# Patient Record
Sex: Female | Born: 1960 | Hispanic: No | Smoking: Never smoker
Health system: Southern US, Community
[De-identification: ages and names within clinical notes are randomized; demographics above are authoritative.]

## PROBLEM LIST (undated history)

## (undated) DIAGNOSIS — R7303 Prediabetes: Secondary | ICD-10-CM

## (undated) DIAGNOSIS — T7840XA Allergy, unspecified, initial encounter: Secondary | ICD-10-CM

## (undated) DIAGNOSIS — K219 Gastro-esophageal reflux disease without esophagitis: Secondary | ICD-10-CM

## (undated) DIAGNOSIS — I10 Essential (primary) hypertension: Secondary | ICD-10-CM

## (undated) DIAGNOSIS — H269 Unspecified cataract: Secondary | ICD-10-CM

## (undated) HISTORY — DX: Allergy, unspecified, initial encounter: T78.40XA

## (undated) HISTORY — DX: Unspecified cataract: H26.9

## (undated) HISTORY — DX: Gastro-esophageal reflux disease without esophagitis: K21.9

## (undated) HISTORY — PX: TUBAL LIGATION: SHX77

## (undated) HISTORY — DX: Prediabetes: R73.03

---

## 2002-05-24 ENCOUNTER — Ambulatory Visit (HOSPITAL_COMMUNITY): Admission: RE | Admit: 2002-05-24 | Discharge: 2002-05-24 | Payer: Self-pay | Admitting: Family Medicine

## 2002-05-31 ENCOUNTER — Encounter: Admission: RE | Admit: 2002-05-31 | Discharge: 2002-05-31 | Payer: Self-pay | Admitting: Family Medicine

## 2002-05-31 ENCOUNTER — Encounter: Payer: Self-pay | Admitting: Family Medicine

## 2003-07-06 ENCOUNTER — Ambulatory Visit (HOSPITAL_COMMUNITY): Admission: RE | Admit: 2003-07-06 | Discharge: 2003-07-06 | Payer: Self-pay | Admitting: Family Medicine

## 2012-12-29 ENCOUNTER — Ambulatory Visit: Payer: No Typology Code available for payment source | Attending: Internal Medicine

## 2013-02-14 ENCOUNTER — Ambulatory Visit: Payer: Self-pay | Admitting: Internal Medicine

## 2013-04-04 ENCOUNTER — Encounter: Payer: Self-pay | Admitting: Internal Medicine

## 2013-04-04 ENCOUNTER — Ambulatory Visit: Payer: No Typology Code available for payment source | Attending: Internal Medicine | Admitting: Internal Medicine

## 2013-04-04 VITALS — BP 125/86 | HR 76 | Temp 98.3°F | Resp 14 | Ht 62.0 in | Wt 167.0 lb

## 2013-04-04 DIAGNOSIS — Z23 Encounter for immunization: Secondary | ICD-10-CM

## 2013-04-04 DIAGNOSIS — R51 Headache: Secondary | ICD-10-CM | POA: Insufficient documentation

## 2013-04-04 DIAGNOSIS — Z0000 Encounter for general adult medical examination without abnormal findings: Secondary | ICD-10-CM

## 2013-04-04 LAB — CBC WITH DIFFERENTIAL/PLATELET
Basophils Absolute: 0 10*3/uL (ref 0.0–0.1)
Basophils Relative: 0 % (ref 0–1)
Eosinophils Absolute: 0 10*3/uL (ref 0.0–0.7)
Eosinophils Relative: 1 % (ref 0–5)
HCT: 37 % (ref 36.0–46.0)
Hemoglobin: 12.9 g/dL (ref 12.0–15.0)
Lymphocytes Relative: 35 % (ref 12–46)
Lymphs Abs: 1.9 10*3/uL (ref 0.7–4.0)
MCH: 31.2 pg (ref 26.0–34.0)
MCHC: 34.9 g/dL (ref 30.0–36.0)
MCV: 89.6 fL (ref 78.0–100.0)
Monocytes Absolute: 0.3 10*3/uL (ref 0.1–1.0)
Monocytes Relative: 5 % (ref 3–12)
Neutro Abs: 3.2 10*3/uL (ref 1.7–7.7)
Neutrophils Relative %: 59 % (ref 43–77)
Platelets: 232 10*3/uL (ref 150–400)
RBC: 4.13 MIL/uL (ref 3.87–5.11)
RDW: 13 % (ref 11.5–15.5)
WBC: 5.3 10*3/uL (ref 4.0–10.5)

## 2013-04-04 LAB — LIPID PANEL
Cholesterol: 167 mg/dL (ref 0–200)
HDL: 50 mg/dL (ref >39)
LDL Cholesterol: 98 mg/dL (ref 0–99)
Total CHOL/HDL Ratio: 3.3 Ratio
Triglycerides: 94 mg/dL (ref <150)
VLDL: 19 mg/dL (ref 0–40)

## 2013-04-04 LAB — COMPLETE METABOLIC PANEL WITH GFR
ALT: 34 U/L (ref 0–35)
AST: 25 U/L (ref 0–37)
Albumin: 4.3 g/dL (ref 3.5–5.2)
Alkaline Phosphatase: 65 U/L (ref 39–117)
BUN: 16 mg/dL (ref 6–23)
CO2: 27 mEq/L (ref 19–32)
Calcium: 9.4 mg/dL (ref 8.4–10.5)
Chloride: 106 mEq/L (ref 96–112)
Creat: 0.49 mg/dL — ABNORMAL LOW (ref 0.50–1.10)
GFR, Est African American: >89 mL/min
Glucose, Bld: 94 mg/dL (ref 70–99)
Potassium: 4.1 mEq/L (ref 3.5–5.3)
Sodium: 139 mEq/L (ref 135–145)
Total Bilirubin: 0.4 mg/dL (ref 0.2–1.2)
Total Protein: 6.6 g/dL (ref 6.0–8.3)

## 2013-04-04 LAB — POCT GLYCOSYLATED HEMOGLOBIN (HGB A1C): Hemoglobin A1C: 5.4

## 2013-04-04 LAB — TSH: TSH: 0.803 u[IU]/mL (ref 0.350–4.500)

## 2013-04-04 NOTE — Progress Notes (Signed)
Pt is here to establish care. Requests a physical and pap smear. Will have to reschedule an appointment for pap smear. Pt is using the interpreter line.

## 2013-04-04 NOTE — Progress Notes (Signed)
Patient ID: Alexandra Jennings, female   DOB: [birth year], 53 y.o.   MRN: [id number]   CC:  HPI:  53 year old female here to establish care, she complains of intermittent headaches once or twice a month especially when she's stressed out, history of migraine headaches. She has no other complaints. She had a mammogram 6 years ago, Pap smear 6 years ago.    Social history nonsmoker nonalcoholic Family history positive for cervical cancer in the mother Prostrate cancer in the father   Not on File History reviewed. No pertinent past medical history. No current outpatient prescriptions on file prior to visit.   No current facility-administered medications on file prior to visit.   Family History  Problem Relation Age of Onset  . Cancer Mother   . Cancer Father    History   Social History  . Marital Status: Single    Spouse Name: N/A    Number of Children: N/A  . Years of Education: N/A   Occupational History  . Not on file.   Social History Main Topics  . Smoking status: Never Smoker   . Smokeless tobacco: Not on file  . Alcohol Use: No  . Drug Use: No  . Sexual Activity: Not on file   Other Topics Concern  . Not on file   Social History Narrative  . No narrative on file    Review of Systems  Constitutional: Negative for fever, chills, diaphoresis, activity change, appetite change and fatigue.  HENT: Negative for ear pain, nosebleeds, congestion, facial swelling, rhinorrhea, neck pain, neck stiffness and ear discharge.   Eyes: Negative for pain, discharge, redness, itching and visual disturbance.  Respiratory: Negative for cough, choking, chest tightness, shortness of breath, wheezing and stridor.   Cardiovascular: Negative for chest pain, palpitations and leg swelling.  Gastrointestinal: Negative for abdominal distention.  Genitourinary: Negative for dysuria, urgency, frequency, hematuria, flank pain, decreased urine volume, difficulty urinating and  dyspareunia.  Musculoskeletal: Negative for back pain, joint swelling, arthralgias and gait problem.  Neurological: Negative for dizziness, tremors, seizures, syncope, facial asymmetry, speech difficulty, weakness, light-headedness, numbness and headaches.  Hematological: Negative for adenopathy. Does not bruise/bleed easily.  Psychiatric/Behavioral: Negative for hallucinations, behavioral problems, confusion, dysphoric mood, decreased concentration and agitation.    Objective:   Filed Vitals:   04/04/13 1027  BP: 125/86  Pulse: 76  Temp: 98.3 F (36.8 C)  Resp: 14    Physical Exam  Constitutional: Appears well-developed and well-nourished. No distress.  HENT: Normocephalic. External right and left ear normal. Oropharynx is clear and moist.  Eyes: Conjunctivae and EOM are normal. PERRLA, no scleral icterus.  Neck: Normal ROM. Neck supple. No JVD. No tracheal deviation. No thyromegaly.  CVS: RRR, S1/S2 +, no murmurs, no gallops, no carotid bruit.  Pulmonary: Effort and breath sounds normal, no stridor, rhonchi, wheezes, rales.  Abdominal: Soft. BS +,  no distension, tenderness, rebound or guarding.  Musculoskeletal: Normal range of motion. No edema and no tenderness.  Lymphadenopathy: No lymphadenopathy noted, cervical, inguinal. Neuro: Alert. Normal reflexes, muscle tone coordination. No cranial nerve deficit. Skin: Skin is warm and dry. No rash noted. Not diaphoretic. No erythema. No pallor.  Psychiatric: Normal mood and affect. Behavior, judgment, thought content normal.   No results found for this basename: WBC, HGB, HCT, MCV, PLT   No results found for this basename: CREATININE, BUN, NA, K, CL, CO2    No results found for this basename: HGBA1C   Lipid Panel  No results found for  this basename: chol, trig, hdl, cholhdl, vldl, ldlcalc       Assessment and plan:   There are no active problems to display for this patient.  Occasional headaches Patient to continue  taking when necessary Tylenol once or twice a month History of migraine headaches No further workup indicated  Establish care Gynecology referral for a Pap smear next is schedule the patient for a mammogram Tetanus and hepatitis B vaccine today Baseline labs The patient will return in one month to complete her series of 0, 1, 6 months of hepatitis B vaccination     Otherwise follow up in 3 months    The patient was given clear instructions to go to ER or return to medical center if symptoms don't improve, worsen or new problems develop. The patient verbalized understanding. The patient was told to call to get any lab results if not heard anything in the next week.

## 2013-04-05 ENCOUNTER — Telehealth: Payer: Self-pay | Admitting: *Deleted

## 2013-04-05 NOTE — Telephone Encounter (Signed)
Contacted pt with the interpreter line to notify pt of her lab results.

## 2013-04-05 NOTE — Telephone Encounter (Signed)
Message copied by Silvia Hightower, [place] R on Tue Apr 05, 2013 10:06 AM ------      Message from: Allyson Sabal MD, Ascencion Dike      Created: Tue Apr 05, 2013  9:49 AM       Notify patient of the labs are normal ------

## 2013-04-22 ENCOUNTER — Telehealth: Payer: Self-pay | Admitting: Internal Medicine

## 2013-04-22 NOTE — Telephone Encounter (Signed)
Pt is requesting a referral for a PAP Smear; please f/u with pt about PAP for her next visit 03/05

## 2013-05-05 ENCOUNTER — Ambulatory Visit: Payer: No Typology Code available for payment source | Attending: Internal Medicine

## 2013-05-05 ENCOUNTER — Ambulatory Visit: Payer: Self-pay | Admitting: Internal Medicine

## 2013-05-05 DIAGNOSIS — Z0000 Encounter for general adult medical examination without abnormal findings: Secondary | ICD-10-CM

## 2013-05-05 NOTE — Patient Instructions (Signed)
Pt is to return in August for her last injection.

## 2013-05-05 NOTE — Progress Notes (Unsigned)
Pt is here for her second series of hep B shot.

## 2013-05-19 ENCOUNTER — Ambulatory Visit: Payer: Self-pay

## 2013-05-26 ENCOUNTER — Other Ambulatory Visit (HOSPITAL_COMMUNITY)
Admission: RE | Admit: 2013-05-26 | Discharge: 2013-05-26 | Disposition: A | Discharge status: Home / Self Care | Payer: No Typology Code available for payment source | Source: Ambulatory Visit | Attending: Internal Medicine | Admitting: Internal Medicine

## 2013-05-26 ENCOUNTER — Encounter: Payer: Self-pay | Admitting: Internal Medicine

## 2013-05-26 ENCOUNTER — Ambulatory Visit: Payer: No Typology Code available for payment source | Attending: Internal Medicine | Admitting: Internal Medicine

## 2013-05-26 VITALS — BP 137/89 | HR 81 | Temp 99.1°F | Resp 16 | Ht 62.0 in | Wt 170.0 lb

## 2013-05-26 DIAGNOSIS — Z01419 Encounter for gynecological examination (general) (routine) without abnormal findings: Secondary | ICD-10-CM | POA: Insufficient documentation

## 2013-05-26 DIAGNOSIS — Z124 Encounter for screening for malignant neoplasm of cervix: Secondary | ICD-10-CM

## 2013-05-26 DIAGNOSIS — Z0000 Encounter for general adult medical examination without abnormal findings: Secondary | ICD-10-CM

## 2013-05-26 DIAGNOSIS — Z113 Encounter for screening for infections with a predominantly sexual mode of transmission: Secondary | ICD-10-CM | POA: Insufficient documentation

## 2013-05-26 DIAGNOSIS — N760 Acute vaginitis: Secondary | ICD-10-CM | POA: Insufficient documentation

## 2013-05-26 DIAGNOSIS — Z1151 Encounter for screening for human papillomavirus (HPV): Secondary | ICD-10-CM | POA: Insufficient documentation

## 2013-05-26 NOTE — Patient Instructions (Signed)
Prueba de Papanicolaou (Papanicolaou Test) Es una prueba que se [place] para la deteccin del cncer de cuello de tero. Para realizarla, se extraen clulas del cuello del tero que un patlogo (un especialista en el estudio de los tejidos del organismo) examina bajo un microscopio. Las clulas se extraen del cuello del tero mediante el raspado o el cepillado del cuello del tero y sus paredes internas. Es Alexandra Jennings prueba muy precisa para la deteccin del cncer de cuello de tero y ha sido muy til para reducir las muertes a causa de esta enfermedad. Las pruebas de Papanicolaou se informan en trminos de neoplasia cervical intraepitelial (NIC). El trmino neoplasia significa crecimiento nuevo, intraepitelial significa cambios a nivel celular y cervicouterina significa en el cuello del tero. Los subtipos de NIC son los siguientes:   NIC1: displasia (tejido anormal) leve y leve a moderado  NIC2: displasia moderada y leve a grave  NIC3: displasia grave y carcinoma (tumor maligno) en proceso La prueba de Papanicolaou, cuando se la realiza peridicamente, ha resultado de gran ayuda para la deteccin temprana del cncer de cuello de tero, que es tratable si se lo descubre en su fase inicial. Adems, se la [place] para detectar anomalas o hallazgos atpicos. En muchos casos, estos hallazgos son parte del proceso de reparacin del organismo y a menudo se resuelven sin necesidad de Alexandra Jennings. Si se hace un lavado vaginal, se da un bao de inmersin o se aplica cremas vaginales 48 a 72horas antes del examen, los resultados de la prueba podran ser "insatisfactorios". PREPARACIN PARA EL ESTUDIO La prueba debe realizarse cuando no tiene el perodo menstrual ni sangrado vaginal. HALLAZGOS NORMALES No se observan clulas anormales o atpicas. Los rangos para los resultados normales pueden variar entre diferentes laboratorios y hospitales. Consulte siempre con su mdico despus de Alexandra Jennings, Alexandra Jennings estudio para Alexandra Jennings  significado de los Alexandra Jennings y si los valores se consideran "dentro de los lmites normales". SIGNIFICADO DEL ESTUDIO  El mdico leer los resultados y Alexandra Jennings con usted sobre la importancia y el significado de los Alexandra Jennings, as como las opciones de tratamiento y la necesidad de Alexandra Jennings pruebas adicionales, si fuera necesario. OBTENCIN DE LOS RESULTADOS DE LAS PRUEBAS  Es su responsabilidad retirar el resultado del Alexandra Jennings. Consulte en el laboratorio cundo y cmo podr Alexandra Jennings. Document Released: 12/08/2012 Alexandra Jennings Patient Information 2014 [place], [place].

## 2013-05-26 NOTE — Progress Notes (Signed)
Pt is here to have a Pap smear. Pt has an interpretor.

## 2013-05-26 NOTE — Progress Notes (Signed)
Patient ID: Alexandra Jennings, female   DOB: 1960-11-09, 53 y.o.   MRN: [id number]   Alexandra Jennings, is a 53 y.o. female  FYB:[id number]  NID:[id number]  DOB - [birth year]  No chief complaint on file.       Subjective:   Alexandra Jennings is a 53 y.o. female here today for a follow up visit. Patient has no significant past medical history, she's not on any medication, she is here today only for Pap smear. She claims to be doing well at home, engaging and exercise as tolerated. She is up-to-date in her preventative care including immunizations. Patient has No headache, No chest pain, No abdominal pain - No Nausea, No new weakness tingling or numbness, No Cough - SOB.  Problem  Preventative Health Care  Pap Smear for Cervical Cancer Screening    ALLERGIES: No Known Allergies  PAST MEDICAL HISTORY: History reviewed. No pertinent past medical history.  MEDICATIONS AT HOME: Prior to Admission medications   Not on File     Objective:   Filed Vitals:   05/26/13 1437  BP: 137/89  Pulse: 81  Temp: 99.1 F (37.3 C)  TempSrc: Oral  Resp: 16  Height: 5\' 2"  (1.575 m)  Weight: 170 lb (77.111 kg)  SpO2: 98%    Exam General appearance : Awake, alert, not in any distress. Speech Clear. Not toxic looking HEENT: Atraumatic and Normocephalic, pupils equally reactive to light and accomodation Neck: supple, no JVD. No cervical lymphadenopathy.  Chest:Good air entry bilaterally, no added sounds  CVS: S1 S2 regular, no murmurs.  Abdomen: Bowel sounds present, Non tender and not distended with no gaurding, rigidity or rebound. Extremities: B/L Lower Ext shows no edema, both legs are warm to touch Neurology: Awake alert, and oriented X 3, CN II-XII intact, Non focal Skin:No Rash Wounds:N/A  Data Review Lab Results  Component Value Date   HGBA1C 5.4 04/04/2013     Assessment & Plan   1. Preventative health care - Cytology - PAP - Cervicovaginal ancillary  only  2. Pap smear for cervical cancer screening  - Cytology - PAP - Cervicovaginal ancillary only  Patient was extensively counseled on nutrition and exercise  Interpreter was used to communicate directly with patient for the entire encounter including providing detailed patient instructions.    Return in about 1 year (around 05/27/2014), or if symptoms worsen or fail to improve, for Annual Physical.  The patient was given clear instructions to go to ER or return to medical center if symptoms don't improve, worsen or new problems develop. The patient verbalized understanding. The patient was told to call to get lab results if they haven't heard anything in the next week.   This note has been created with Surveyor, quantity. Any transcriptional errors are unintentional.    Angelica Chessman, MD, [place], [place], [place] and [place], [place]   05/26/2013, 3:02 PM

## 2013-05-27 LAB — CERVICOVAGINAL ANCILLARY ONLY
CHLAMYDIA, DNA PROBE: NEGATIVE
NEISSERIA GONORRHEA: NEGATIVE
WET PREP (BD AFFIRM): POSITIVE — AB
Wet Prep (BD Affirm): NEGATIVE
Wet Prep (BD Affirm): NEGATIVE

## 2013-05-30 ENCOUNTER — Telehealth: Payer: Self-pay | Admitting: *Deleted

## 2013-05-31 ENCOUNTER — Telehealth: Payer: Self-pay | Admitting: *Deleted

## 2013-05-31 NOTE — Telephone Encounter (Signed)
Message copied by Joan Mayans on Tue May 31, 2013 10:38 AM ------      Message from: Tresa Garter      Created: Fri May 27, 2013  5:40 PM       Please inform patient that her Pap smear shows evidence of infection with bacterial vaginosis. This is not a sexually transmitted disease. We will treat with antibiotics for 7 days.            Please call in prescription metronidazole tablet 500 mg by mouth twice a day for 7 days. We are awaiting the Pap smear cytology ------

## 2013-06-03 ENCOUNTER — Ambulatory Visit
Admission: RE | Admit: 2013-06-03 | Discharge: 2013-06-03 | Disposition: A | Discharge status: Home / Self Care | Payer: No Typology Code available for payment source | Source: Ambulatory Visit | Attending: Internal Medicine | Admitting: Internal Medicine

## 2013-06-03 DIAGNOSIS — Z0000 Encounter for general adult medical examination without abnormal findings: Secondary | ICD-10-CM

## 2013-07-04 ENCOUNTER — Ambulatory Visit: Payer: Self-pay | Admitting: Internal Medicine

## 2013-10-05 ENCOUNTER — Ambulatory Visit: Payer: No Typology Code available for payment source | Attending: Internal Medicine | Admitting: *Deleted

## 2013-10-05 DIAGNOSIS — Z23 Encounter for immunization: Secondary | ICD-10-CM | POA: Insufficient documentation

## 2013-10-05 DIAGNOSIS — Z0000 Encounter for general adult medical examination without abnormal findings: Secondary | ICD-10-CM | POA: Insufficient documentation

## 2013-10-05 NOTE — Patient Instructions (Addendum)
Hepatitis B Vaccine, Recombinant injection Qu es este medicamento? La VACUNA CONTRA LA HEPATITIS B es una vacuna. Se utiliza para prevenir una infeccin con el virus de la hepatitis B. Este medicamento puede ser utilizado para otros usos; si tiene alguna pregunta consulte con su proveedor de atencin mdica o con su farmacutico. MARCAS COMERCIALES DISPONIBLES: Engerix-B, Recombivax HB Qu le debo informar a mi profesional de la salud antes de tomar este medicamento? Necesita saber si usted presenta alguno de los siguientes problemas o situaciones: -fiebre, infeccin -enfermedad cardiaca -infeccin por hepatitis B -problemas del sistema inmunolgico -enfermedad renal -una reaccin alrgica o inusual a las vacunas, levadura, a otros medicamentos, alimentos, colorantes o conservantes -si est embarazada o buscando quedar embarazada -si est amamantando a un beb Cmo debo utilizar este medicamento? Esta vacuna se administra mediante inyeccin por va intramuscular. Lo administra un profesional de [employer]	[place]. Recibir un folleto de informacin por escrito acerca de la vacuna en el momento de cada vacunacin. Asegrese de leer este folleto cada vez cuidadosamente. El folleto puede cambiar con frecuencia. Hable con su pediatra para informarse acerca del uso de este medicamento en nios. Aunque este medicamento ha sido recetado a nios tan menores como los bebs recin nacidos, para condiciones selectivas, las precauciones se aplican. Sobredosis: Pngase en contacto inmediatamente con un centro toxicolgico o una sala de urgencia si usted cree que haya tomado demasiado medicamento. ATENCIN: [employer] es solo para usted. No comparta este medicamento con nadie. Qu sucede si me olvido de una dosis? Es importante no olvidar ninguna dosis. Informe a su mdico o a su profesional de la salud si no puede asistir a Photographer. Qu puede interactuar con este medicamento? -medicamentos que suprimen  su funcin inmunolgica, tales como adalimumab, anakinra, infliximab -medicamentos para tratar el cncer -medicamentos esteroideos, como la prednisona o la cortisona Puede ser que esta lista no menciona todas las posibles interacciones. Informe a su profesional de [employer]	[place] de [employer] productos a base de hierbas, medicamentos de [place] o suplementos nutritivos que est tomando. Si usted fuma, consume bebidas alcohlicas o si utiliza drogas ilegales, indqueselo tambin a su profesional de [employer]	[place]. Algunas sustancias pueden interactuar con su medicamento. A qu debo estar atento al usar [employer]? Visite a su proveedor de atencin mdica para recibir todas sus vacunas como le haya indicado. Debe recibir 3 inyecciones de esta vacuna para proteccin contra la hepatitis B. Informe a su mdico inmediatamente sobre Leisure centre manager secundario grave o inusual despus de recibir la vacuna. Qu efectos secundarios puedo tener al [employer] este medicamento? Efectos secundarios que debe informar a su mdico o a Barrister's clerk de la salud tan pronto como sea posible: -Chief of Staff como erupcin cutnea, picazn o urticarias, hinchazn de la cara, labios o lengua -problemas respiratorios -confusin, irritabilidad -pulso cardiaco rpido, irregular -sndrome tipo gripal -entumecimiento, dolor hormigueo -convulsiones -cansancio o debilidad inusual Efectos secundarios que, por lo general, no requieren atencin mdica (debe informarlos a su mdico o a su profesional de la salud si persisten o si son molestos): -diarrea -fiebre -dolor de cabeza -prdida del apetito -dolor muscular -nuseas -dolor, enrojecimiento, hinchazn o irritacin en la zona de la inyeccin -cansancio Puede ser que esta lista no menciona todos los posibles efectos secundarios. Comunquese a su mdico por asesoramiento mdico [employer]. Usted puede informar los efectos secundarios a la FDA por  telfono al 1-800-FDA-1088. Dnde debo guardar mi medicina? Este medicamento se administra en hospitales o clnicas y no necesitar  guardarlo en su domicilio. ATENCIN: Este folleto es un resumen. Puede ser que no cubra toda la posible informacin. Si usted tiene preguntas acerca de esta medicina, consulte con su mdico, su farmacutico o su profesional de Technical sales engineer.  2015, Elsevier/[employer]. (2013-06-20 13:27:45) Reposo, hielo, compresin y elevacin: Cuidados de rutina para las lesiones (RICE: Routine Care for Injuries) Los cuidados de rutina de muchas lesiones incluyen reposo, hielo, compresin y elevacin. INSTRUCCIONES PARA EL CUIDADO EN EL HOGAR  El reposo es necesario para permitir que el cuerpo se cure. Podrn reanudarse las actividades de rutina cuando se sienta cmodo. Las lesiones en tendones y huesos pueden tardar hasta seis semanas en curarse. Los tendones son estructuras similares a cuerdas que [employer] al [employer].  Si aplicamos hielo luego de una lesin, podemos evitar la hinchazn y Dietitian.  Ponga el hielo en una bolsa plstica.  Colquese una toalla entre la piel y la bolsa de hielo.  Deje el hielo durante 15 a 73minutos, 3 a 4veces por da, o [place] se encuentre despierto, durante las primeras 24 a 65 horas. Luego, contine segn las indicaciones del mdico.  La compresin ayuda a reducir la hinchazn. Tambin brinda apoyo y [place] a [employer]. Si hoy le han colocado un vendaje elstico, debe retirarlo y colocarlo nuevamente cada 3 o 4 horas. No debe aplicarlo de manera muy apretada, pero s con la firmeza necesaria para evitar la hinchazn. Controle los dedos de las manos o de los pies y observe si se hinchan, se tornan azules, se enfran, se adormecen o si siente dolor intenso. Si se presentan algunos de estos sntomas, retire el vendaje y aplquelo nuevamente de un modo ms flojo. Comunquese con  su mdico si contina con estos problemas.  La elevacin ayuda a reducir la hinchazn y Therapist, art. En el caso de las extremidades, [employer], las [place], las piernas y [employer], Social research officer, government el rea lesionada por encima del nivel del corazn, si es posible. SOLICITE ATENCIN MDICA DE INMEDIATO SI:  El dolor o la hinchazn persisten.  La zona est roja, adormecida o la siente dbil.  Los sntomas empeoran en vez de mejorar durante [employer]. Estos sntomas pueden indicar que es necesaria una evaluacin ms profunda o nuevas radiografas. En algunos casos, las radiografas no muestran que hay un hueso pequeo roto (fractura) hasta 1semana o 10das ms tarde. Cumpla con las citas de seguimiento con el mdico. Consulte con su mdico la fecha en que los [place] de las radiografas estarn disponibles. Asegrese de [employer] de las radiografas. Document Released: 11/27/2004 Document Revised: 02/22/2013 [facility] Patient Information 2015 [place]. This information is not intended to replace advice given to you by your health care provider. Make sure you discuss any questions you have with your health care provider.

## 2013-10-11 ENCOUNTER — Ambulatory Visit: Payer: No Typology Code available for payment source

## 2013-10-11 ENCOUNTER — Ambulatory Visit (HOSPITAL_COMMUNITY)
Admission: RE | Admit: 2013-10-11 | Discharge: 2013-10-11 | Disposition: A | Discharge status: Home / Self Care | Payer: No Typology Code available for payment source | Source: Ambulatory Visit | Attending: Internal Medicine | Admitting: Internal Medicine

## 2013-10-11 VITALS — BP 119/82 | HR 69 | Temp 98.1°F | Resp 16

## 2013-10-11 DIAGNOSIS — R0789 Other chest pain: Secondary | ICD-10-CM

## 2013-10-11 DIAGNOSIS — R071 Chest pain on breathing: Secondary | ICD-10-CM | POA: Insufficient documentation

## 2013-10-11 MED ORDER — NAPROXEN 500 MG PO TABS: 500.0000 mg | ORAL_TABLET | Freq: Two times a day (BID) | ORAL | Status: DC

## 2013-10-11 NOTE — Progress Notes (Unsigned)
Patient presents to walk-in clinic with c/o left sided pain under the arm from a fall 4 weeks ago. Patient states she took Ibuprofen 200 mg one in the morning and one at night for one week. Patient states the pain is 5/10 and is a sharp pain. Consulted with Dr. Doreene Burke. Verbal order received for chest x-ray and Naproxen 500 mg twice a day. Patient informed via telephone interpreter [phone]) regarding plan of care and verbalized understanding. Vivia Birmingham, RN

## 2013-10-11 NOTE — Patient Instructions (Signed)
Naproxen and naproxen sodium oral immediate-release tablets Qu es este medicamento? El NAPROXENO es un medicamento antiinflamatorio no esteroideo (AINE). Se utiliza para reducir la inflamacin y Regulatory affairs officer. Este medicamento se puede Risk manager para Scientist, research (medical), dolor de [place] y perodos menstruales dolorosos. Este medicamento tambin sirve para tratar problemas dolorosos de las articulaciones y los msculos, tales como artritis, tendinitis, bursitis y Statistician. Este medicamento puede ser utilizado para otros usos; si tiene alguna pregunta consulte con su proveedor de atencin mdica o con su farmacutico. MARCAS COMERCIALES DISPONIBLES: Aflaxen, Aleve, Aleve Arthritis, All Day Relief, Anaprox, Anaprox DS, Naprosyn Qu le debo informar a mi profesional de la salud antes de tomar este medicamento? Necesita saber si usted presenta alguno de los siguientes problemas o situaciones: -asma -fuma -consume ms de 3 bebidas alcohlicas por da -enfermedad cardiaca o problemas circulatorios, tales como insuficiencia cardiaca o edema de pierna (retencin de lquido) -alta presin sangunea -enfermedad renal -enfermedad heptica -lceras o sangrado estomacal -una reaccin alrgica o inusual al naproxeno, a la aspirina, a otros AINE, otros medicamentos, alimentos, colorantes o conservantes -si est embarazada o buscando quedar embarazada -si est amamantando a un beb Cmo debo [employer]? Tome este medicamento por va oral con un vaso de agua. Siga las instrucciones de la etiqueta del [place]. Si este medicamento le produce Higher education careers adviser, tmelo con alimentos. Trate de no acostarse por lo menos 10 minutos despus de tomarlo. Tome sus dosis a intervalos regulares. No tome su medicamento con una frecuencia mayor a la indicada. El uso prolongado puede aumentar el riesgo de sufrir un ataque cardiaco o un derrame cerebral. Su farmacutico le dar una [place] del medicamento especial  con cada receta y relleno. Asegrese de leer esta informacin cada vez cuidadosamente. Hable con su pediatra para informarse acerca del uso de este medicamento en nios. Puede requerir atencin especial. Sobredosis: Pngase en contacto inmediatamente con un centro toxicolgico o una sala de urgencia si usted cree que haya tomado demasiado medicamento. ATENCIN: [employer] es solo para usted. No comparta este medicamento con nadie. Qu sucede si me olvido de una dosis? Si olvida una dosis, tmela lo antes posible. Si es casi la hora de la prxima dosis, tome slo esa dosis. No tome dosis adicionales o dobles. Qu puede interactuar con este medicamento? -alcohol -aspirina -cidofovir -diurticos -litio -metotrexato -otros medicamentos antiinflamatorios, tales Stage manager o prednisona -pemetrexed -probenecid -warfarina Puede ser que esta lista no menciona todas las posibles interacciones. Informe a su profesional de KB Home	[place] de [employer] Corporation productos a base de hierbas, medicamentos de [place] o suplementos nutritivos que est tomando. Si usted fuma, consume bebidas alcohlicas o si utiliza drogas ilegales, indqueselo tambin a su profesional de KB Home	[place]. Algunas sustancias pueden interactuar con su medicamento. A qu debo estar atento al usar Coca-Cola? Si el dolor no mejora, informe a su mdico o a su profesional de KB Home	[place]. Consulte con su mdico antes de tomar otros analgsicos. No se trate usted mismo. Este medicamento no previene ataques cardiacos o derrames cerebrales. De hecho, este medicamento puede aumentar la posibilidad de Insurance risk surveyor un ataque cardiaco o un derrame cerebral. La posibilidad puede aumentar con el uso prolongado de este medicamento y en pacientes con enfermedad cardiaca. Si est tomando aspirina para la prevencin de ataques cardiacos o derrames cerebrales, comunquese con su mdico o su profesional de KB Home	[place]. Evite tomar otros medicamentos que  contienen aspirina, ibuprofeno o naproxeno con Coca-Cola. Es probable que se Pensions consultant  secundarios, tales como molestias estomacales, nuseas o lceras. No debe de tomar Coca-Cola con muchos medicamentos disponibles de [employer]G Corporation. Este medicamento puede provocar lceras y hemorragia del estmago e intestinos en cualquier momento durante tratamiento. No fume ni ingiera alcohol. Esto irrita an ms el estmago y puede hacerlo ms susceptible a dao por el uso de Coca-Cola. Pueden ocurrir lceras y hemorragia sin sntomas de Hydrographic surveyor y Futures trader la [place]. Puede experimentar mareos o somnolencia. No conduzca ni utilice maquinaria ni haga nada que Associate Professor en estado de alerta hasta que sepa cmo le afecta este medicamento. No se siente ni se ponga de pie con rapidez, especialmente si es un paciente de edad avanzada. Esto reduce el riesgo de mareos o [employer]. Este medicamento puede hacerle sangrar con mayor facilidad. Trate de no lastimarse los dientes y las encas al cepillarlos o limpiarlos con hilo dental. Qu efectos secundarios puedo tener al Mas[employer] Corporation este medicamento? Efectos secundarios que debe informar a su mdico o a Barrister's clerk de la salud tan pronto como sea posible: -heces de color oscuro o con sangre, sangre en la orina o vmito con sangre -visin borrosa -dolor en el pecho -dificultad al respirar o sibilancias -nuseas o vmito -dolor de estmago severo -erupcin cutnea, enrojecimiento, ampollas o descamacin de la piel, urticarias o picazn -hablar arrastrando las palabras o debilidad en un lado del cuerpo -hinchazn de prpados, garganta o labios -aumento de peso o hinchazn que no tienen explicacin -cansancio o debilidad inusual -color amarillento de los ojos o la piel Efectos secundarios que, por lo general, no requieren atencin mdica (debe informarlos a su mdico o a su profesional de la salud si persisten o si son  molestos): -estreimiento -dolor de cabeza -acidez de estmago Puede ser que esta lista no menciona todos los posibles efectos secundarios. Comunquese a su mdico por asesoramiento mdico Humana Inc. Usted puede informar los efectos secundarios a la FDA por telfono al 1-800-FDA-1088. Dnde debo guardar mi medicina? Mantngala fuera del alcance de los nios. Gurdela a [employer], entre 15 y 60 grados C (46 y 57 grados F). Mantenga le envase bien cerrado. Deseche todo el medicamento que no haya utilizado, despus de la fecha de vencimiento. ATENCIN: Este folleto es un resumen. Puede ser que no cubra toda la posible informacin. Si usted tiene preguntas acerca de esta medicina, consulte con su mdico, su farmacutico o su profesional de Technical sales engineer.  2015, Elsevier/Gold Standard. (2009-03-26 16:00:30)

## 2013-10-17 ENCOUNTER — Telehealth: Payer: Self-pay | Admitting: Internal Medicine

## 2013-10-17 NOTE — Telephone Encounter (Signed)
Pt. Came in wanting results of x-ray. Please f/u with pt.

## 2013-10-18 NOTE — Telephone Encounter (Signed)
Patient notified via interpreter of normal x-ray results.

## 2013-11-14 ENCOUNTER — Emergency Department (INDEPENDENT_AMBULATORY_CARE_PROVIDER_SITE_OTHER)
Admission: EM | Admit: 2013-11-14 | Discharge: 2013-11-14 | Disposition: A | Discharge status: Home / Self Care | Payer: Self-pay | Source: Home / Self Care | Attending: Family Medicine | Admitting: Family Medicine

## 2013-11-14 ENCOUNTER — Encounter (HOSPITAL_COMMUNITY): Payer: Self-pay | Admitting: Emergency Medicine

## 2013-11-14 DIAGNOSIS — H1131 Conjunctival hemorrhage, right eye: Secondary | ICD-10-CM

## 2013-11-14 DIAGNOSIS — H1130 Conjunctival hemorrhage, unspecified eye: Secondary | ICD-10-CM

## 2013-11-14 DIAGNOSIS — H6093 Unspecified otitis externa, bilateral: Secondary | ICD-10-CM

## 2013-11-14 DIAGNOSIS — H60399 Other infective otitis externa, unspecified ear: Secondary | ICD-10-CM

## 2013-11-14 MED ORDER — CIPROFLOXACIN-DEXAMETHASONE 0.3-0.1 % OT SUSP
4.0000 [drp] | Freq: Two times a day (BID) | OTIC | Status: DC
Start: 2013-11-14 — End: 2015-01-16

## 2013-11-14 NOTE — ED Notes (Signed)
C/o left ear pain onset Friday night w/mild HA Also reports redness of eye onset yest am when she woke up Denies inj/trauma, f/v/n/d Alert, no signs of acute distress.

## 2013-11-14 NOTE — Discharge Instructions (Signed)
Your right ear is infected and your left ear has a small injury inside the ear canal. Please use the antibioitic drops in both ears The right eye bleeding should go away in a few days. If his continues to happen or gets worse please follow up with your regular doctor.   Su oreja derecha est infectada y la oreja izquierda tiene una pequea lesin en el interior del canal [place]. Utilice las gotas antibioitic en ambos odos El sangrado del ojo derecho Marketing executive en unos [employer] . Si su contina a suceder o empeora favor seguimiento con su mdico de cabecera .  Otitis externa (Otitis Externa) La otitis externa es una infeccin bacteriana o por hongos en el conducto auditivo externo. Esta es el rea desde el tmpano hasta el exterior de la [place]. Tambin se llama "odo de nadador". CAUSAS  Las posibles causas de la infeccin son:  Tonye Becket en agua sucia.  Humedad que queda en el odo despus de nadar o baarse.  Lesin leve en el odo (traumatismo).  Objetos atascados en el odo (cuerpo extrao).  Cortes o raspones (abrasiones) en la parte exterior del odo. SIGNOS Y SNTOMAS  En general, el primer sntoma de infeccin es la picazn en el canal auditivo. Ms tarde, los signos y los sntomas pueden ser hinchazn y enrojecimiento del conducto auditivo, dolor de odo y supuracin de lquido de color blanco amarillento (pus). El Social research officer, government de odo puede empeorar cuando tira el lbulo de la [place]. DIAGNSTICO  Su mdico le Chartered certified accountant examen fsico. Podr tomar Truddie Coco de lquido de la oreja y Hydrographic surveyor bacterias u hongos. TRATAMIENTO  Las gotas antibiticas para los odos se administran generalmente durante 10 a 14 das. El tratamiento tambin puede incluir analgsicos o corticoides para reducir la comezn y la hinchazn. INSTRUCCIONES PARA EL CUIDADO EN EL HOGAR   Aplique gotas de antibitico en el conducto auditivo segn lo indicado por el mdico.  Tome los medicamentos solamente  como se lo haya indicado el mdico.  Si tiene diabetes, siga las instrucciones adicionales de tratamiento que le haya dado el mdico.  Consulting civil engineer a todas las visitas de control como se lo haya indicado el mdico. PREVENCIN   Mantenga el odo seco. Use la punta de una toalla para absorber el agua del canal auditivo despus de nadar o del bao.  Evite rascarse o poner objetos en el interior del odo. Esto puede daar el conducto auditivo o eliminar la cera protectora que recubre el conducto. Esto facilita el crecimiento de las bacterias y hongos.  Evite [employer], en agua contaminada, o en las piscinas mal cloradas.  Puede usar gotas para los odos hechas de alcohol y vinagre despus de nadar. Mezcle en partes iguales el vinagre blanco y el alcohol en una botella. Ponga 3 o 4 gotas en cada odo despus de nadar. SOLICITE ATENCIN MDICA SI:   Jaclynn Guarneri.  Su odo contina rojo, hinchado, le duele o supura pus despus de 3 das.  El dolor, la hinchazn o el enrojecimiento empeoran.  Sufre un dolor intenso de [place].  Tiene en la zona detrs de la oreja roja, hinchada, le duele o est sensible. ASEGRESE DE QUE:   Comprende estas instrucciones.  Controlar su afeccin.  Recibir ayuda de inmediato si no mejora o si empeora. Document Released: 02/17/2005 Document Revised: 07/04/2013 [facility] Patient Information 2015 [place], [place]. This information is not intended to replace advice given to you by your health care provider. Make sure you  discuss any questions you have with your health care provider.  Hemorragia subconjuntival (Subconjunctival Hemorrhage) Una hemorragia subconjuntival es una mancha de color rojo brillante que cubre una porcin de la zona blanca del ojo. La zona blanca del ojo se denomina esclera y esta cubierta una membrana delgada denominada conjuntiva. sta es una membrana transparemte, excepto por los pequeos vasos sanguneos que se observan a simple  vista. Cuando del ojo se irrita o se inflama y enrojece, los vasos de la conjuntiva estn hinchados. En algunos casos, un vaso sanguneo puede romperse sangrar. Cuando esto ocurre, la sangre se acumula entre la conjuntiva y Radio broadcast assistant y se extiende hacia afuera originando una zona roja. La mancha roja puede ser muy pequea al principio. Luego puede extenderse hasta cubrir una gran parte de la superficie del ojo o an toda la parte blanca visible. En casi todos los casos, Designer, television/film set y el ojo estar blanco nuevamente. Sin embargo, antes de [employer], la zona roja se puede extender. Tambin puede tomar un color marrn amarillento, antes de desaparecer. Si se acumula mucha sangre debajo de la conjuntiva puede aparecer como un bulto en la superficie del ojo. Esto puede atemorizarlo, pero finalmente se aplanar y Armed forces operational officer. La hemorragia subconjuntival no causa dolor, pero si hay hinchazn puede causar una sensacin de irritacin. No afecta la visin.  CAUSAS  La causa ms frecuente es un traumatismo leve (frotarse el ojo, irritacin).  La hemorragia subconjuntival puede aparecer debido a la tos o a esfuerzos (levantar objetos pesados) vmitos, o estornudos.  En algunos casos, su mdico le medir la presin arterial. La hipertensin arterial tambin puede causar una hemorragia subconjuntival.  Traumatismos graves o heridas cortantes.  Enfermedades que afectan la coagulacin sangunea (hemofilia, leucemia).  Anormalidades de los vasos sanguneos que se encuentran detrs del ojo (fstula del seno carotdeo cavernoso)  Tumores detrs del ojo.  Ciertos medicamentos (aspirina, coumadin, heparina).  Cirugas recientes del ojo. INSTRUCCIONES PARA EL CUIDADO DOMICILIARIO  No se preocupe por la apariencia del ojo. Puede continuar con sus actividades habituales.  En general, no es Regulatory affairs officer. SOLICITE ATENCIN MDICA SI:  Comienza a Tree surgeon.  La hemorragia no desaparece luego de 3 semanas.  Sangrando ocurre en otra parte bajo la piel, en la boca, o en el otro ojo.  Sufre hemorragias subconjuntivales recurrentes. SOLICITE ATENCIN MDICA DE INMEDIATO SI:  Su visin cambia o tiene dificultades en la vista.  Comienza a sentir un dolor de cabeza intenso, vmitos persistentes, confusin o letargo.  El ojo parece abultado o que protruye de la rbita.  Nota la aparicin repentina de hematomas o tiene hemorragias espontneas en algn otro lugar del cuerpo. Document Released: 11/27/2004 Document Revised: 05/12/2011 [facility] Patient Information 2015 [place]. This information is not intended to replace advice given to you by your health care provider. Make sure you discuss any questions you have with your health care provider.

## 2013-11-14 NOTE — ED Provider Notes (Signed)
CSN: [id number]     Arrival date & time 11/14/13  1250 History   First MD Initiated Contact with Patient 11/14/13 1444     Chief Complaint  Patient presents with  . Otalgia  . Eye Problem   (Consider location/radiation/quality/duration/timing/severity/associated sxs/prior Treatment) HPI  Eye redess: started Sunday. R eye. Improving. Deneis recent URI. Denies pain, discharge. Minimal change in vision.   R ear pain: started Friday. Associated w/ mild headache. Worse w/ wind. Has not tried anything for the pain. Mild. Comes and goes. Throbbing. deneis fevers, discharge, dizziness. Mild loss of hearing.     History reviewed. No pertinent past medical history. Past Surgical History  Procedure Laterality Date  . Tubal ligation     Family History  Problem Relation Age of Onset  . Cancer Mother   . Cancer Father    History  Substance Use Topics  . Smoking status: Never Smoker   . Smokeless tobacco: Not on file  . Alcohol Use: No   OB History   Grav Para Term Preterm Abortions TAB SAB Ect Mult Living                 Review of Systems  Allergies  Review of patient's allergies indicates no known allergies.  Home Medications   Prior to Admission medications   Medication Sig Start Date End Date Taking? Authorizing Provider  naproxen (NAPROSYN) 500 MG tablet Take 1 tablet (500 mg total) by mouth 2 (two) times daily with a meal. 10/11/13   Tresa Garter, MD   LMP 04/05/2011 Physical Exam  Constitutional: She is oriented to person, place, and time. She appears well-developed and well-nourished.  HENT:  Focal red region of the lateral aspect of the R eye between the sclera and conjunctiva. EOMI, PERRL. No mass or discoloration.  L external canal w/ irritation and small open wound along the posterior wall R external canal w/ weaping and canal swelling and erythema/injection  Neck: Normal range of motion.  Cardiovascular: Normal rate, normal heart sounds and intact distal  pulses.   No murmur heard. Pulmonary/Chest: Effort normal and breath sounds normal.  Abdominal: Soft. Bowel sounds are normal.  Musculoskeletal: Normal range of motion. She exhibits no edema and no tenderness.  Neurological: She is alert and oriented to person, place, and time. No cranial nerve deficit. Coordination normal.  Skin: Skin is warm and dry.  Psychiatric: She has a normal mood and affect. Her behavior is normal. Judgment and thought content normal.    ED Course  Procedures (including critical care time) Labs Review Labs Reviewed - No data to display  Imaging Review No results found.   MDM   1. Otitis externa, bilateral   2. Subconjunctival hemorrhage of right eye    ciprodex Ibuprofen  Subconjunctival hemorrhage. Likely to self resolve. Denies trauma or possible source/cause of increased pressure such as cough or sneezing. Precautions given. Denies trauma. May need coagulation workup if not improving or worsens   Linna Darner, MD Family Medicine 11/14/2013, 3:04 PM    Waldemar Dickens, MD 11/14/13 1504

## 2013-11-16 ENCOUNTER — Telehealth (HOSPITAL_COMMUNITY): Payer: Self-pay | Admitting: Emergency Medicine

## 2013-11-16 NOTE — ED Notes (Signed)
[facility] on 9/14 and 9/15 and said the Rx. of Ciprodex is $ 250.000 and said it would be much cheaper if it is divided into Dexamethasone and Oxyfloxacin drops.  Discussed with Dr. Marily Memos and he approved dividing the Rx. into the 2 medicines. I called the pharmacy back and gave the message to the person that answered the phone.  Yves Dill was not there now but said she would give her the message. Roselyn Meier 11/16/2013.

## 2014-03-16 ENCOUNTER — Ambulatory Visit: Payer: Self-pay | Attending: Internal Medicine | Admitting: Internal Medicine

## 2014-03-16 ENCOUNTER — Ambulatory Visit (HOSPITAL_COMMUNITY)
Admission: RE | Admit: 2014-03-16 | Discharge: 2014-03-16 | Disposition: A | Discharge status: Home / Self Care | Payer: Self-pay | Source: Ambulatory Visit | Attending: Cardiology | Admitting: Cardiology

## 2014-03-16 ENCOUNTER — Other Ambulatory Visit: Payer: Self-pay

## 2014-03-16 ENCOUNTER — Encounter: Payer: Self-pay | Admitting: Internal Medicine

## 2014-03-16 VITALS — BP 154/84 | HR 90 | Temp 98.0°F | Resp 16

## 2014-03-16 DIAGNOSIS — R51 Headache: Secondary | ICD-10-CM | POA: Insufficient documentation

## 2014-03-16 DIAGNOSIS — Z0000 Encounter for general adult medical examination without abnormal findings: Secondary | ICD-10-CM

## 2014-03-16 DIAGNOSIS — R002 Palpitations: Secondary | ICD-10-CM | POA: Insufficient documentation

## 2014-03-16 LAB — COMPLETE METABOLIC PANEL WITH GFR
ALT: 37 U/L — ABNORMAL HIGH (ref 0–35)
AST: 24 U/L (ref 0–37)
Albumin: 4.6 g/dL (ref 3.5–5.2)
Alkaline Phosphatase: 77 U/L (ref 39–117)
BILIRUBIN TOTAL: 0.4 mg/dL (ref 0.2–1.2)
BUN: 11 mg/dL (ref 6–23)
CO2: 29 meq/L (ref 19–32)
CREATININE: 0.54 mg/dL (ref 0.50–1.10)
Calcium: 10.2 mg/dL (ref 8.4–10.5)
Chloride: 100 mEq/L (ref 96–112)
GFR, Est African American: >89 mL/min
GFR, Est Non African American: >89 mL/min
GLUCOSE: 83 mg/dL (ref 70–99)
Potassium: 5.2 mEq/L (ref 3.5–5.3)
Sodium: 137 mEq/L (ref 135–145)
Total Protein: 7.6 g/dL (ref 6.0–8.3)

## 2014-03-16 LAB — CBC WITH DIFFERENTIAL/PLATELET
Basophils Absolute: 0 10*3/uL (ref 0.0–0.1)
Basophils Relative: 0 % (ref 0–1)
EOS ABS: 0.1 10*3/uL (ref 0.0–0.7)
Eosinophils Relative: 1 % (ref 0–5)
HCT: 41.9 % (ref 36.0–46.0)
HEMOGLOBIN: 14.3 g/dL (ref 12.0–15.0)
Lymphocytes Relative: 30 % (ref 12–46)
Lymphs Abs: 2.6 10*3/uL (ref 0.7–4.0)
MCH: 31 pg (ref 26.0–34.0)
MCHC: 34.1 g/dL (ref 30.0–36.0)
MCV: 90.9 fL (ref 78.0–100.0)
MPV: 11.2 fL (ref 8.6–12.4)
Monocytes Absolute: 0.4 10*3/uL (ref 0.1–1.0)
Monocytes Relative: 5 % (ref 3–12)
Neutro Abs: 5.4 10*3/uL (ref 1.7–7.7)
Neutrophils Relative %: 64 % (ref 43–77)
Platelets: 275 10*3/uL (ref 150–400)
RBC: 4.61 MIL/uL (ref 3.87–5.11)
RDW: 13.4 % (ref 11.5–15.5)
WBC: 8.5 10*3/uL (ref 4.0–10.5)

## 2014-03-16 LAB — LIPID PANEL
Cholesterol: 196 mg/dL (ref 0–200)
HDL: 49 mg/dL (ref >39)
LDL CALC: 127 mg/dL — AB (ref 0–99)
Total CHOL/HDL Ratio: 4 Ratio
Triglycerides: 102 mg/dL (ref <150)
VLDL: 20 mg/dL (ref 0–40)

## 2014-03-16 LAB — POCT GLYCOSYLATED HEMOGLOBIN (HGB A1C): HEMOGLOBIN A1C: 5.5

## 2014-03-16 NOTE — Patient Instructions (Signed)
Plan de alimentacin DASH (DASH Eating Plan) DASH es la sigla en ingls de "Enfoques Alimentarios para Detener la Hipertensin". El plan de alimentacin DASH ha demostrado bajar la presin arterial elevada (hipertensin). Los beneficios adicionales para la salud pueden incluir la disminucin del riesgo de diabetes mellitus tipo2, enfermedades cardacas e ictus. Este plan tambin puede ayudar a Horticulturist, commercial. QU DEBO SABER ACERCA DEL PLAN DE ALIMENTACIN DASH? Para el plan de alimentacin DASH, seguir las siguientes pautas generales:  Elija los alimentos con un valor porcentual diario de sodio de menos del 5% (segn figura en la etiqueta del alimento).  Use hierbas o aderezos sin sal, en lugar de sal de mesa o sal marina.  Consulte al mdico o farmacutico antes de usar sustitutos de la sal.  Coma productos con bajo contenido de sodio, cuya etiqueta suele decir "bajo contenido de sodio" o "sin agregado de sal".  Coma alimentos frescos.  Coma ms verduras, frutas y productos lcteos con bajo contenido de [place].  Elija los cereales integrales. Busque la palabra "integral" en Equities trader de la lista de ingredientes.  Elija el pescado y el pollo o el pavo sin piel ms a menudo que las carnes rojas. Limite el consumo de pescado, carne de ave y carne a 6onzas (170g) por Training and development officer.  Limite el consumo de dulces, postres, azcares y bebidas azucaradas.  Elija las grasas saludables para el corazn.  Limite el consumo de queso a 1onza (28g) por Training and development officer.  Consuma ms comida casera y menos de restaurante, de buf y comida rpida.  Limite el consumo de alimentos fritos.  Cocine los alimentos utilizando mtodos que no sean la fritura.  Limite las verduras enlatadas. Si las consume, enjuguelas bien para disminuir el sodio.  Cuando coma en un restaurante, pida que preparen su comida con menos sal o, en lo posible, sin nada de sal. QU ALIMENTOS PUEDO COMER? Pida ayuda a un nutricionista para  conocer las necesidades calricas individuales. Cereales Pan de salvado o integral. Arroz integral. Pastas de salvado o integrales. Quinua, trigo burgol y cereales integrales. Cereales con bajo contenido de sodio. Tortillas de harina de maz o de salvado. Pan de maz integral. Galletas saladas integrales. Galletas con bajo contenido de [place]. Vegetales Verduras frescas o congeladas (crudas, al vapor, asadas o grilladas). Jugos de tomate y verduras con contenido bajo o reducido de sodio. Pasta y salsa de tomate con contenido bajo o [place]. Verduras enlatadas con bajo contenido de sodio o reducido de sodio.  Lambert Mody Lambert Mody frescas, en conserva (en su jugo natural) o frutas congeladas. Carnes y otros productos con protenas Carne de res molida (al 85% o ms [place]), carne de res de animales alimentados con pastos o carne de res sin la grasa. Pollo o pavo sin piel. Carne de pollo o de [place]. Cerdo sin la grasa. Todos los pescados y frutos de mar. Huevos. Porotos, guisantes o lentejas secos. Frutos secos y semillas sin sal. Frijoles enlatados sin sal. Lcteos Productos lcteos con bajo contenido de grasas, como [place] o al 1%, quesos reducidos en grasas o al 2%, ricota con bajo contenido de grasas o [employer], o yogur natural con bajo contenido de [place]. Quesos con contenido bajo o reducido de sodio. Grasas y Naval architect en barra que no contengan grasas trans. [place] y alios para ensaladas livianos o reducidos en grasas (reducidos en sodio). [place]. Aceites de crtamo, oliva o canola. [place] natural de man o almendra. Otros Palomitas de maz y pretzels sin sal.  Los artculos mencionados arriba pueden no ser [employer] Company de las bebidas o los alimentos recomendados. Comunquese con el nutricionista para conocer ms opciones. QU ALIMENTOS NO SE RECOMIENDAN? Cereales Pan blanco. Pastas blancas. Arroz blanco. Pan de maz refinado. Bagels y  croissants. Galletas saladas que contengan grasas trans. Vegetales Vegetales con crema o fritos. Verduras en [place]. Verduras enlatadas comunes. Pasta y salsa de tomate en lata comunes. Jugos comunes de tomate y de verduras. Lambert Mody Frutas secas. Fruta enlatada en almbar liviano o espeso. Jugo de frutas. Carnes y otros productos con protenas [place] de carne con Lobbyist. Costillas, alas de pollo, tocineta, salchicha, mortadela, salame, chinchulines, tocino, perros calientes, salchichas alemanas y embutidos envasados. Frutos secos y semillas con sal. Frijoles con sal en lata. Lcteos Leche entera o al 2%, crema, mezcla de [place] y crema, y queso crema. Yogur entero o endulzado. Quesos o queso azul con alto contenido de Physicist, medical. [place] no lcteas y coberturas batidas. Quesos procesados, quesos para untar o cuajadas. Condimentos Sal de cebolla y ajo, sal condimentada, sal de mesa y sal marina. Salsas en lata y envasadas. Salsa [place]. Salsa trtara. Salsa barbacoa. Salsa teriyaki. Salsa de soja, incluso la que tiene contenido reducido de [place]. Salsa de carne. Salsa de pescado. Salsa de [place]. Salsa rosada. Rbano picante. Ketchup y mostaza. Saborizantes y tiernizantes para carne. Caldo en cubitos. Salsa picante. Salsa tabasco. Adobos. Aderezos para tacos. Salsas. Grasas y aceites [place], [place] en barra, [place] de [place], [place], [place] clarificada y Wendee Copp de tocino. Aceites de coco, de palmiste o de palma. Aderezos comunes para ensalada. Otros Pickles y [employer]. Palomitas de maz y pretzels con sal. Los artculos mencionados arriba pueden no ser [employer] Foods Company de las bebidas y los alimentos que se Higher education careers adviser. Comunquese con el nutricionista para obtener ms informacin. DNDE Dolan Amen MS INFORMACIN? [place], del Pulmn y de la Sangre (National Heart, Lung, and [place]):  [email or web address] Document Released: 02/06/2011 Document Revised: 07/04/2013 [facility] Patient Information 2015 [place], [place]. This information is not intended to replace advice given to you by your health care provider. Make sure you discuss any questions you have with your health care provider.

## 2014-03-16 NOTE — Progress Notes (Signed)
Patient ID: Alexandra Jennings, female   DOB: 10-10-1960, 54 y.o.   MRN: [id number]   Alexandra Jennings, is a 54 y.o. female  CBJ:[id number]  HYW:[id number]  DOB - January 21, 1961  Chief Complaint  Patient presents with  . Headache        Subjective:   Alexandra Jennings is a 54 y.o. female here today for a follow up visit. Patient has no significant past medical history, not on any prescribed chronic medication here today for major complaints of headache near the nape of her neck for the past 2 weeks, she also feels like her heart was racing as if she has palpitation. She denies chest pain, no dizziness, no fainting attack, no falls. Patient is able to eat and drink without restrictions, no change in bowel habits. She does not smoke cigarette, she does not drink coffee.Patient says headache is like a band around the neck. Patient has not had colonoscopy. Her mammogram was normal in 2015 as well as her Pap smear in 2015. No chest pain, No abdominal pain - No Nausea, No new weakness tingling or numbness, No Cough - SOB.  No problems updated.  ALLERGIES: No Known Allergies  PAST MEDICAL HISTORY: History reviewed. No pertinent past medical history.  MEDICATIONS AT HOME: Prior to Admission medications   Medication Sig Start Date End Date Taking? Authorizing Provider  ciprofloxacin-dexamethasone (CIPRODEX) otic suspension Place 4 drops into both ears 2 (two) times daily. 11/14/13   Waldemar Dickens, MD  naproxen (NAPROSYN) 500 MG tablet Take 1 tablet (500 mg total) by mouth 2 (two) times daily with a meal. 10/11/13   Tresa Garter, MD     Objective:   Filed Vitals:   03/16/14 1613  BP: 154/84  Pulse: 90  Temp: 98 F (36.7 C)  Resp: 16  SpO2: 100%    Exam General appearance : Awake, alert, not in any distress. Speech Clear. Not toxic looking HEENT: Atraumatic and Normocephalic, pupils equally reactive to light and accomodation Neck: supple, no JVD. No cervical  lymphadenopathy.  Chest:Good air entry bilaterally, no added sounds  CVS: S1 S2 regular, no murmurs.  Abdomen: Bowel sounds present, Non tender and not distended with no gaurding, rigidity or rebound. Extremities: B/L Lower Ext shows no edema, both legs are warm to touch Neurology: Awake alert, and oriented X 3, CN II-XII intact, Non focal Skin:No Rash Wounds:N/A  Data Review Lab Results  Component Value Date   HGBA1C 5.50 03/16/2014   HGBA1C 5.4 04/04/2013     Assessment & Plan   1. Heart palpitations  - EKG 12-Lead: Read by me, normal sinus rhythm - Symptoms are most likely stress related - Patient has been counseled extensively  2. Preventative health care  - HM COLONOSCOPY - Ambulatory referral to Gastroenterology - CBC with Differential - COMPLETE METABOLIC PANEL WITH GFR - POCT glycosylated hemoglobin (Hb A1C) - Lipid panel - TSH - Urinalysis, Complete  Patient was counseled extensively about nutrition and exercise  Interpreter was used to communicate directly with patient for the entire encounter including providing detailed patient instructions.   Return in about 6 months (around 09/14/2014), or if symptoms worsen or fail to improve, for Routine Follow Up.  The patient was given clear instructions to go to ER or return to medical center if symptoms don't improve, worsen or new problems develop. The patient verbalized understanding. The patient was told to call to get lab results if they haven't heard anything in the next week.   This note  has been created with Surveyor, quantity. Any transcriptional errors are unintentional.    Angelica Chessman, MD, [place], [place], [place] and [place], [place]   04/05/2014, 12:35 PM

## 2014-03-16 NOTE — Progress Notes (Signed)
Interpreter line used Patient complains of headache near the nape of her neck the past two weeks Patient also complains of having some heart palpitations as well Patient states at times it feels like her heart is racing

## 2014-03-17 LAB — URINALYSIS, COMPLETE
BACTERIA UA: NONE SEEN
Bilirubin Urine: NEGATIVE
CRYSTALS: NONE SEEN
Casts: NONE SEEN
Glucose, UA: NEGATIVE mg/dL
HGB URINE DIPSTICK: NEGATIVE
Ketones, ur: NEGATIVE mg/dL
Nitrite: NEGATIVE
PROTEIN: NEGATIVE mg/dL
Specific Gravity, Urine: 1.011 (ref 1.005–1.030)
Squamous Epithelial / LPF: NONE SEEN
UROBILINOGEN UA: 0.2 mg/dL (ref 0.0–1.0)
pH: 5.5 (ref 5.0–8.0)

## 2014-03-17 LAB — TSH: TSH: 1.538 u[IU]/mL (ref 0.350–4.500)

## 2014-03-24 ENCOUNTER — Telehealth: Payer: Self-pay | Admitting: Emergency Medicine

## 2014-03-24 NOTE — Telephone Encounter (Signed)
-----   Message from Tresa Garter, MD sent at 03/22/2014  6:17 PM EST ----- Please inform patient that her laboratory test results are mostly within normal limit except for a cholesterol that is slightly high, advise patient on low cholesterol low fat diet and regular physical exercise at least 3 times a week 30 minutes each time.

## 2014-03-24 NOTE — Telephone Encounter (Signed)
Pt given lab results with instructions to adhere to low fat diet with regular physical exercise  Pt verbalized understanding per [place] interpretor line

## 2014-04-13 ENCOUNTER — Ambulatory Visit: Payer: Self-pay | Attending: Internal Medicine

## 2014-05-25 ENCOUNTER — Telehealth: Payer: Self-pay | Admitting: Internal Medicine

## 2014-05-25 NOTE — Telephone Encounter (Signed)
Pt came into office requesting a referral to ophthalmology. Please f/u with pt if PCP visit is required

## 2014-11-10 ENCOUNTER — Ambulatory Visit: Payer: Self-pay | Attending: Internal Medicine

## 2014-12-21 ENCOUNTER — Ambulatory Visit: Payer: Self-pay | Attending: Internal Medicine | Admitting: Internal Medicine

## 2014-12-21 ENCOUNTER — Encounter: Payer: Self-pay | Admitting: Internal Medicine

## 2014-12-21 VITALS — BP 122/78 | HR 77 | Temp 98.6°F | Resp 18 | Ht 62.0 in | Wt 161.2 lb

## 2014-12-21 DIAGNOSIS — K219 Gastro-esophageal reflux disease without esophagitis: Secondary | ICD-10-CM | POA: Insufficient documentation

## 2014-12-21 DIAGNOSIS — Z0000 Encounter for general adult medical examination without abnormal findings: Secondary | ICD-10-CM

## 2014-12-21 MED ORDER — PANTOPRAZOLE SODIUM 40 MG PO TBEC: 40.0000 mg | DELAYED_RELEASE_TABLET | Freq: Every day | ORAL | Status: DC

## 2014-12-21 NOTE — Progress Notes (Signed)
Patient ID: Alexandra Jennings, female   DOB: 03/07/1960, 54 y.o.   MRN: [id number]   Subjective:  Patient ID: Alexandra Jennings, female    DOB: [birth year]  Age: 54 y.o. MRN: [id number]  CC: Pain   HPI Alexandra Jennings is a 54 years old Hispanic pleasant woman with no significant past medical history presents today for stomach pain, pain has been present for two weeks, pain is described as burning, scaled at 7, radiates to her throat. Patient states the discomfort has lessened over the two weeks, when she eats the burning eases, and when she drinks milk. Patient has been using zantac 150 which gives relief, but she takes it when necessary. Patient denies nausea, no vomiting, no diarrhea, no constipation. She denies chest pain. No palpitation. Patient is not on any medication on a chronic basis. She is up-to-date with her mammogram and Pap smear, but she has not had colonoscopy.  Outpatient Prescriptions Prior to Visit  Medication Sig Dispense Refill  . ciprofloxacin-dexamethasone (CIPRODEX) otic suspension Place 4 drops into both ears 2 (two) times daily. (Patient not taking: Reported on 12/21/2014) 7.5 mL 0  . naproxen (NAPROSYN) 500 MG tablet Take 1 tablet (500 mg total) by mouth 2 (two) times daily with a meal. (Patient not taking: Reported on 12/21/2014) 30 tablet 0   No facility-administered medications prior to visit.    ROS Review of Systems  Constitutional: Negative for chills, activity change and appetite change.  HENT: Negative for congestion.   Eyes: Negative for discharge and itching.  Cardiovascular: Negative for chest pain, palpitations and leg swelling.  Gastrointestinal: Positive for abdominal pain. Negative for nausea, vomiting, diarrhea, constipation, blood in stool and abdominal distention.       Epigastric pain  Endocrine: Negative.   Genitourinary: Negative.   Musculoskeletal: Negative.   Skin: Negative.   Neurological: Negative.   Psychiatric/Behavioral:  Negative for behavioral problems, confusion, decreased concentration and agitation.    Objective:  BP 144/82 mmHg  Pulse 77  Temp(Src) 98.6 F (37 C) (Oral)  Resp 18  Ht 5\' 2"  (1.575 m)  Wt 161 lb 3.2 oz (73.12 kg)  BMI 29.48 kg/m2  SpO2 100%  LMP 04/05/2011  BP/Weight 12/21/2014 03/16/2014 11/04/90  Systolic BP 330 076 226  Diastolic BP 82 84 82  Wt. (Lbs) 161.2 - -  BMI 29.48 - -    Lab Results  Component Value Date   WBC 8.5 03/16/2014   HGB 14.3 03/16/2014   HCT 41.9 03/16/2014   PLT 275 03/16/2014   GLUCOSE 83 03/16/2014   CHOL 196 03/16/2014   TRIG 102 03/16/2014   HDL 49 03/16/2014   LDLCALC 127* 03/16/2014   ALT 37* 03/16/2014   AST 24 03/16/2014   NA 137 03/16/2014   K 5.2 03/16/2014   CL 100 03/16/2014   CREATININE 0.54 03/16/2014   BUN 11 03/16/2014   CO2 29 03/16/2014   TSH 1.538 03/16/2014   HGBA1C 5.50 03/16/2014    Physical Exam  Constitutional: She is oriented to person, place, and time. She appears well-developed and well-nourished. No distress.  HENT:  Head: Normocephalic and atraumatic.  Eyes: Conjunctivae and EOM are normal. Pupils are equal, round, and reactive to light. Right eye exhibits no discharge. Left eye exhibits no discharge. No scleral icterus.  Neck: Normal range of motion. Neck supple. No JVD present. No tracheal deviation present. No thyromegaly present.  Cardiovascular: Normal rate, regular rhythm, normal heart sounds and intact distal pulses.  Exam reveals no gallop  and no friction rub.   No murmur heard. Pulmonary/Chest: Breath sounds normal. No stridor. No respiratory distress. She has no wheezes.  Abdominal: Soft. Bowel sounds are normal. She exhibits no distension. There is no tenderness. There is no rebound and no guarding.  Musculoskeletal: Normal range of motion. She exhibits no edema or tenderness.  Neurological: She is alert and oriented to person, place, and time. She has normal reflexes. No cranial nerve deficit.    Skin: Skin is warm and dry. She is not diaphoretic.  Nursing note and vitals reviewed.    Assessment & Plan:   1. Gastroesophageal reflux disease without esophagitis  - pantoprazole (PROTONIX) 40 MG tablet; Take 1 tablet (40 mg total) by mouth daily.  Dispense: 90 tablet; Refill: 3 - Patient was counseled extensively about nutrition and exercise  2. Routine physical examination  - Ambulatory referral to Gastroenterology for Colon Cancer Screening  Repeat BP 122/78 mmHg  Meds ordered this encounter  Medications  . pantoprazole (PROTONIX) 40 MG tablet    Sig: Take 1 tablet (40 mg total) by mouth daily.    Dispense:  90 tablet    Refill:  3    Follow-up: Return in about 6 months (around 06/21/2015) for Annual Physical.    Angelica Chessman, MD, [place], [place], [place], [place] and [place], [place]   12/21/2014, 9:44 AM

## 2014-12-21 NOTE — Patient Instructions (Signed)
Enfermedad por reflujo gastroesofgico en los adultos (Gastroesophageal Reflux Disease, Adult) Normalmente, los alimentos descienden por el esfago y se depositan en el estmago para su digestin. Sin embargo, cuando una persona tiene enfermedad por reflujo gastroesofgico (ERGE), los alimentos y el cido estomacal regresan al esfago. Cuando esto ocurre, el esfago se irrita y se inflama. Con el tiempo, la ERGE puede provocar la formacin de pequeas perforaciones (lceras) en la mucosa del esfago.  CAUSAS Un problema del msculo que se encuentra entre el esfago y Product manager (esfnter esofgico inferior o EEI) es la causa de esta enfermedad. Por lo general, el esfnter esofgico inferior se cierra despus de que los alimentos pasan a travs del esfago hacia el [place]. Cuando el EEI est debilitado o no es normal, no se cierra correctamente, lo que permite el paso retrgrado de los alimentos y el cido estomacal al esfago. Algunas sustancias de la dieta, algunos medicamentos y Arboriculturist enfermedades pueden debilitar este esfnter, entre ellos:  Consumo de tabaco.  Clay.  Hernia de hiato.  Consumo excesivo de alcohol.  Algunos alimentos y ciertas bebidas, como el caf, el chocolate, las cebollas y Production manager. FACTORES DE RIESGO Es ms probable que esta afeccin se manifieste en:  Los personas con sobrepeso.  Las personas con trastornos del tejido conjuntivo.  Las personas que toman antiinflamatorios no esteroides (AINE). SNTOMAS Los sntomas de esta afeccin incluyen lo siguiente:  Merchant navy officer.  Dificultad o dolor al tragar.  Sensacin de Best boy un bulto en la garganta.  Sabor amargo en la boca.  Mal aliento.  Gran cantidad de saliva.  Malestar estomacal o meteorismo.  Flatulencias.  Dolor en el pecho.  Falta de aire o sibilancias.  Tos permanente (crnica) o tos nocturna.  Desgaste el esmalte dental.  Prdida de peso. El dolor en el pecho puede deberse a  muchas afecciones diferentes. Consulte al mdico si tiene Tourist information centre manager. DIAGNSTICO El mdico le har una historia clnica y un examen fsico. Para determinar si la ERGE es leve o grave, el mdico tambin puede controlar la respuesta al [place]. Tambin pueden hacerle otros estudios, por ejemplo:  Una endoscopia para examinar el estmago y el esfago con Ardelia Mems pequea cmara.  Un estudio que determina el nivel de acidez en el esfago.  Un estudio que mide la presin que hay en el esfago.  Un estudio de deglucin de bario o un estudio modificado de deglucin de bario para mostrar la forma, el tamao y el funcionamiento del esfago. [place] es aliviar los sntomas y Product/process development scientist las complicaciones. El tratamiento de esta afeccin puede variar en funcin de la gravedad de los sntomas. El mdico podr indicar lo siguiente:  Cambios en la dieta.  Medicamentos.  Ciruga. INSTRUCCIONES PARA EL CUIDADO EN EL HOGAR Dieta  Siga la dieta que le haya recomendado el mdico, la cual puede incluir evitar alimentos y bebidas tales como:  Caf y t (con o sin cafena).  Bebidas que contengan alcohol.  Bebidas energizantes y deportivas.  Gaseosas o refrescos.  Chocolate y cacao.  Menta y [place].  Ajo y cebollas.  Rbano picante.  Alimentos muy condimentados y cidos, entre ellos, pimientos, [place] en polvo, curry en polvo, vinagre, salsas picantes y salsa barbacoa.  Frutas ctricas y sus jugos, como naranjas, limones y limas.  Alimentos a base de tomates, como salsa roja, [place], salsa y pizza con salsa roja.  Alimentos fritos y grasos, como rosquillas, papas fritas y aderezos con Counselling psychologist  contenido de [place].  Carnes con alto contenido de [place], como hot dogs y cortes grasos de carnes rojas y blancas, por ejemplo, filetes de entrecot, salchicha, jamn y tocino.  Productos lcteos con alto contenido de [place], como [place], [place] y  queso crema.  Haga comidas pequeas y frecuentes Medical sales representative de comidas abundantes.  Evite beber [place] comidas.  No coma durante las 2 o 3horas previas a la hora de [place].  No se acueste inmediatamente despus de comer.  No haga actividad fsica enseguida despus de comer. Instrucciones generales  Est atento a cualquier cambio en los sntomas.  Tome los medicamentos de venta libre y los recetados solamente como se lo haya indicado el mdico. No tome aspirina, ibuprofeno ni otros antiinflamatorios no esteroides (AINE), a menos que se lo haya indicado el mdico.  No consuma ningn producto que contenga tabaco, lo que incluye cigarrillos, tabaco de Higher education careers adviser y Psychologist, sport and exercise. Si necesita ayuda para dejar de fumar, consulte al mdico.  Use ropas sueltas. No use prendas ajustadas alrededor de la cintura que ejerzan presin en el abdomen.  Levante (eleve) 6pulgadas (15centmetros) la cabecera de la cama.  Trate de reducir [employer] de estrs con actividades como el yoga o la meditacin. Si necesita ayuda para reducir [employer] de estrs, consulte al mdico.  Si tiene sobrepeso, Multimedia programmer un peso saludable. Hable con el mdico acerca de su peso ideal y pdale asesoramiento en cuanto a la dieta que debe seguir para Therapist, music.  Concurra a todas las visitas de control como se lo haya indicado el mdico. Esto es importante. SOLICITE ATENCIN MDICA SI:  Aparecen nuevos sntomas.  Baja de peso sin causa aparente.  Tiene dificultad para tragar o siente dolor al [employer].  Tiene sibilancias o tos persistente.  Los sntomas no mejoran con Dispensing optician.  Tiene la voz ronca. [place] [employer] SI:  Tiene dolor en los brazos, el cuello, los [place], la dentadura o la espalda.  Philbert Riser, se marea o tiene sensacin de desvanecimiento.  Siente falta de aire o Tourist information centre manager.  Vomita y el vmito es  parecido a la sangre o a los granos de caf.  Se desmaya.  Las heces son sanguinolentas o de color negro.  No puede tragar, beber o comer.   Esta informacin no tiene Marine scientist el consejo del mdico. Asegrese de hacerle al mdico cualquier pregunta que tenga.   Document Released: 11/27/2004 Document Revised: 11/08/2014 Elsevier Interactive Patient Education 2016 [place] de alimentos para pacientes con reflujo gastroesofgico - Adultos (Food Choices for Gastroesophageal Reflux Disease, Adult) Cuando se tiene reflujo gastroesofgico (ERGE), los alimentos que se ingieren y los hbitos de alimentacin son Theatre stage manager. Elegir los alimentos adecuados puede ayudar a Public house manager las molestias ocasionadas por el [place]. [place]?  Elija las frutas, los vegetales, los cereales integrales, los productos lcteos, la carne de [place], de pescado y de ave con bajo contenido de grasas.  Limite las grasas, como los [place], los aderezos para [place], la [place], los frutos secos y Publishing copy.  Lleve un registro de las comidas para identificar los alimentos que ocasionan sntomas.  Evite los alimentos que le ocasionen reflujo. Pueden ser distintos para cada persona.  Haga comidas pequeas con frecuencia en lugar de tres comidas Kellogg.  Coma lentamente, en un clima distendido.  Limite el consumo de alimentos fritos.  Cocine los alimentos  utilizando mtodos que no sean la fritura.  Evite el consumo alcohol.  Evite beber grandes cantidades de lquidos con las comidas.  Evite agacharse o recostarse hasta despus de 2 o 3horas de haber comido. QU ALIMENTOS NO SE RECOMIENDAN? Los siguientes son algunos alimentos y bebidas que pueden empeorar los sntomas: Astronomer. Jugo de tomate. Salsa de tomate y espagueti. Ajes. Cebolla y [place]. Rbano picante. Frutas Naranjas, pomelos y limn (fruta y [place]). Carnes Carnes de  [place], de pescado y de ave con gran contenido de grasas. Esto incluye los perros calientes, las [place], el [place], la salchicha, el salame y el tocino. Lcteos [place]he entera y [place]. [employer]. Crema. [place]. [place]. Queso crema.  Bebidas Caf y t negro, con o sin cafena Bebidas gaseosas o energizantes. Condimentos Salsa picante. Salsa barbacoa.  Dulces/postres Chocolate y cacao. [place]. Menta y mentol. Grasas y [employer] Corporation con alto contenido de grasas, incluidas las papas fritas. Otros Vinagre. Especias picantes, como la [employer]ron Corporation, la pimienta blanca, la pimienta roja, la pimienta de cayena, el curry en [place], los clavos de [place], el jengibre y el [place] en polvo. Los artculos mencionados arriba pueden no ser [employer] Foods Company de las bebidas y los alimentos que se Higher education careers adviser. Comunquese con el nutricionista para recibir ms informacin.   Esta informacin no tiene Marine scientist el consejo del mdico. Asegrese de hacerle al mdico cualquier pregunta que tenga.   Document Released: 11/27/2004 Document Revised: 03/10/2014 Elsevier Interactive Patient Education [employer].

## 2014-12-21 NOTE — Progress Notes (Signed)
Patient is here for stomach pain, pain has been present for two weeks, pain is described as burning, scaled at 7. Patient states burning which radiates to her throat. Patient states the discomfort has lessen over the two weeks, when she eats the burning eases. Patient has been using zantac 150 which gives relief.

## 2015-01-01 ENCOUNTER — Telehealth: Payer: Self-pay

## 2015-01-01 NOTE — Telephone Encounter (Signed)
I spoke to pt's daughter, Miss Sharen Counter and she will have to get the medications and I will call back tomorrow.

## 2015-01-03 ENCOUNTER — Other Ambulatory Visit: Payer: Self-pay

## 2015-01-03 DIAGNOSIS — Z1211 Encounter for screening for malignant neoplasm of colon: Secondary | ICD-10-CM

## 2015-01-10 ENCOUNTER — Telehealth: Payer: Self-pay | Admitting: Internal Medicine

## 2015-01-10 NOTE — Telephone Encounter (Signed)
Patient called stating that she was recently prescribed pantoprazole (PROTONIX) 40 MG tablet, but is still having acid reflux. She would like to speak to a nurse for advice. Please f/u with pt.

## 2015-01-10 NOTE — Telephone Encounter (Signed)
Gastroenterology Pre-Procedure Review  Request Date: 01/03/2015 Requesting Physician: Dr. Angelica Chessman  PATIENT REVIEW QUESTIONS: The patient responded to the following health history questions as indicated:    1. Diabetes Melitis: no 2. Joint replacements in the past 12 months: no 3. Major health problems in the past 3 months: no 4. Has an artificial valve or MVP: no 5. Has a defibrillator: no 6. Has been advised in past to take antibiotics in advance of a procedure like teeth cleaning: no 7. Family history of colon cancer: no  8. Alcohol Use: no 9. History of sleep apnea: no     MEDICATIONS & ALLERGIES:    Patient reports the following regarding taking any blood thinners:   Plavix? no Aspirin? no Coumadin? no  Patient confirms/reports the following medications:  Current Outpatient Prescriptions  Medication Sig Dispense Refill  . pantoprazole (PROTONIX) 40 MG tablet Take 1 tablet (40 mg total) by mouth daily. 90 tablet 3  . ciprofloxacin-dexamethasone (CIPRODEX) otic suspension Place 4 drops into both ears 2 (two) times daily. (Patient not taking: Reported on 12/21/2014) 7.5 mL 0  . naproxen (NAPROSYN) 500 MG tablet Take 1 tablet (500 mg total) by mouth 2 (two) times daily with a meal. (Patient not taking: Reported on 12/21/2014) 30 tablet 0  . ranitidine (ZANTAC) 150 MG capsule Take 150 mg by mouth 2 (two) times daily.     No current facility-administered medications for this visit.    Patient confirms/reports the following allergies:  No Known Allergies  No orders of the defined types were placed in this encounter.    AUTHORIZATION INFORMATION Primary Insurance:  ID #:   Group #:  Pre-Cert / Auth required:  Pre-Cert / Auth #:   Secondary Insurance: ID #:  Group #:  Pre-Cert / Auth required:  Pre-Cert / Auth #:   SCHEDULE INFORMATION: Procedure has been scheduled as follows:  Date:  01/30/2015                Time: 1:00 PM Location: [facility] Short  Stay  This Gastroenterology Pre-Precedure Review Form is being routed to the following provider(s): Barney Drain, MD

## 2015-01-11 NOTE — Telephone Encounter (Signed)
SUPREP SPLIT DOSING-REGULAR breakfast then CLEAR LIQUIDS after 9 am.   

## 2015-01-15 MED ORDER — NA SULFATE-K SULFATE-MG SULF 17.5-3.13-1.6 GM/177ML PO SOLN: 1.0000 | ORAL | Status: DC

## 2015-01-15 NOTE — Telephone Encounter (Signed)
Rx sent to the pharmacy and instructions mailed to pt.  

## 2015-01-17 ENCOUNTER — Encounter (HOSPITAL_COMMUNITY): Payer: Self-pay | Admitting: Emergency Medicine

## 2015-01-17 ENCOUNTER — Emergency Department (HOSPITAL_COMMUNITY)
Admission: EM | Admit: 2015-01-17 | Discharge: 2015-01-17 | Disposition: A | Discharge status: Home / Self Care | Payer: Self-pay | Attending: Emergency Medicine | Admitting: Emergency Medicine

## 2015-01-17 DIAGNOSIS — Z79899 Other long term (current) drug therapy: Secondary | ICD-10-CM | POA: Insufficient documentation

## 2015-01-17 DIAGNOSIS — R1013 Epigastric pain: Secondary | ICD-10-CM | POA: Insufficient documentation

## 2015-01-17 DIAGNOSIS — Z9851 Tubal ligation status: Secondary | ICD-10-CM | POA: Insufficient documentation

## 2015-01-17 LAB — URINALYSIS, ROUTINE W REFLEX MICROSCOPIC
BILIRUBIN URINE: NEGATIVE
GLUCOSE, UA: NEGATIVE mg/dL
HGB URINE DIPSTICK: NEGATIVE
KETONES UR: NEGATIVE mg/dL
Nitrite: NEGATIVE
PH: 7 (ref 5.0–8.0)
Protein, ur: NEGATIVE mg/dL
Specific Gravity, Urine: 1.009 (ref 1.005–1.030)

## 2015-01-17 LAB — COMPREHENSIVE METABOLIC PANEL
ALT: 35 U/L (ref 14–54)
AST: 27 U/L (ref 15–41)
Albumin: 4 g/dL (ref 3.5–5.0)
Alkaline Phosphatase: 84 U/L (ref 38–126)
Anion gap: 9 (ref 5–15)
BILIRUBIN TOTAL: 0.4 mg/dL (ref 0.3–1.2)
BUN: 13 mg/dL (ref 6–20)
CHLORIDE: 105 mmol/L (ref 101–111)
CO2: 25 mmol/L (ref 22–32)
CREATININE: 0.6 mg/dL (ref 0.44–1.00)
Calcium: 9.5 mg/dL (ref 8.9–10.3)
Glucose, Bld: 117 mg/dL — ABNORMAL HIGH (ref 65–99)
POTASSIUM: 4.3 mmol/L (ref 3.5–5.1)
Sodium: 139 mmol/L (ref 135–145)
TOTAL PROTEIN: 7.4 g/dL (ref 6.5–8.1)

## 2015-01-17 LAB — LIPASE, BLOOD: LIPASE: 32 U/L (ref 11–51)

## 2015-01-17 LAB — CBC
HCT: 40.7 % (ref 36.0–46.0)
Hemoglobin: 13.8 g/dL (ref 12.0–15.0)
MCH: 31.6 pg (ref 26.0–34.0)
MCHC: 33.9 g/dL (ref 30.0–36.0)
MCV: 93.1 fL (ref 78.0–100.0)
PLATELETS: 232 10*3/uL (ref 150–400)
RBC: 4.37 MIL/uL (ref 3.87–5.11)
RDW: 12.8 % (ref 11.5–15.5)
WBC: 8 10*3/uL (ref 4.0–10.5)

## 2015-01-17 LAB — URINE MICROSCOPIC-ADD ON

## 2015-01-17 MED ORDER — GI COCKTAIL ~~LOC~~
30.0000 mL | Freq: Once | ORAL | Status: AC
  Administered 2015-01-17: 30 mL via ORAL
  Filled 2015-01-17: qty 30

## 2015-01-17 NOTE — ED Provider Notes (Signed)
CSN: [id number]     Arrival date & time 01/17/15  1418 History   First MD Initiated Contact with Patient 01/17/15 1615     Chief Complaint  Patient presents with  . Abdominal Pain    HPI  [name] is an 54 y.o. female with no significant PMH who presents to the ED for evaluation of abdominal pain. She speaks spanish so history was obtained via Personal assistant. Pt reports 1 month history of epigastric pain that she states radiates up her chest and throat. She statse that the pain is burning. She states it is worse at night and when laying down. Denies chest pain, SOB, diaphoresis, lightheadedness, weakness. Denies N/V/D. Denies bloody BM. She states that her PCP started her on 83m protonix daily which has not provided relief. She is scheduled to see GI for f/u and colonoscopy later this month. Denies tobacco, EtOH, or other drug use. Denies fever, chills, urinary symptoms.  History reviewed. No pertinent past medical history. Past Surgical History  Procedure Laterality Date  . Tubal ligation     Family History  Problem Relation Age of Onset  . Cancer Mother   . Cancer Father    Social History  Substance Use Topics  . Smoking status: Never Smoker   . Smokeless tobacco: None  . Alcohol Use: No   OB History    No data available     Review of Systems  All other systems reviewed and are negative.     Allergies  Review of patient's allergies indicates no known allergies.  Home Medications   Prior to Admission medications   Medication Sig Start Date End Date Taking? Authorizing Provider  pantoprazole (PROTONIX) 40 MG tablet Take 1 tablet (40 mg total) by mouth daily. 12/21/14  Yes Olugbemiga EEssie Christine MD  Na Sulfate-K Sulfate-Mg Sulf (SUPREP BOWEL PREP) SOLN Take 1 kit by mouth as directed. Patient not taking: Reported on 01/17/2015 01/15/15   SDanie Binder MD  ranitidine (ZANTAC) 150 MG capsule Take 150 mg by mouth 2 (two) times daily.    Historical Provider, MD    BP 121/85 mmHg  Pulse 78  Temp(Src) 98.6 F (37 C) (Oral)  Resp 18  SpO2 99%  LMP 04/05/2011 Physical Exam  Constitutional: She is oriented to person, place, and time.  HENT:  Right Ear: External ear normal.  Left Ear: External ear normal.  Nose: Nose normal.  Mouth/Throat: Oropharynx is clear and moist. No oropharyngeal exudate.  Eyes: Conjunctivae and EOM are normal. Pupils are equal, round, and reactive to light.  Neck: Normal range of motion. Neck supple. No tracheal deviation present.  Cardiovascular: Normal rate, regular rhythm, normal heart sounds and intact distal pulses.   No murmur heard. Pulmonary/Chest: Effort normal and breath sounds normal. No respiratory distress. She has no wheezes.  Abdominal: Soft. Bowel sounds are normal. She exhibits no distension. There is no tenderness. There is no rebound and no guarding.  Musculoskeletal: She exhibits no edema.  Lymphadenopathy:    She has no cervical adenopathy.  Neurological: She is alert and oriented to person, place, and time. No cranial nerve deficit.  Skin: Skin is warm and dry.  Psychiatric: She has a normal mood and affect.  Nursing note and vitals reviewed.   ED Course  Procedures (including critical care time) Labs Review Labs Reviewed  COMPREHENSIVE METABOLIC PANEL - Abnormal; Notable for the following:    Glucose, Bld 117 (*)    All other components within normal limits  URINALYSIS, ROUTINE W REFLEX MICROSCOPIC (NOT AT [facility]) - Abnormal; Notable for the following:    Leukocytes, UA TRACE (*)    All other components within normal limits  URINE MICROSCOPIC-ADD ON - Abnormal; Notable for the following:    Squamous Epithelial / LPF 0-5 (*)    Bacteria, UA RARE (*)    All other components within normal limits  LIPASE, BLOOD  CBC    Imaging Review No results found. I have personally reviewed and evaluated these images and lab results as part of my medical decision-making.   EKG Interpretation None       MDM   Final diagnoses:  Epigastric pain    Based on clinical presentation and description of symptoms, I believe pt's abdominal pain to be GERD/reflux-related. Nonfocal abdominal exam with no peritoneal signs. No red flags for ACS or other cardiopulmonary etiology. UA, CMP, CBC, and lipase unremarkable. Will give GI cocktail here. Instructed pt to continue on her PPI until GI f/u next week. Return precautions given.     Anne Ng, PA-C 01/17/15 1737  Lacretia Leigh, MD 01/19/15 [phone]

## 2015-01-17 NOTE — Discharge Instructions (Signed)
Your labs today were normal. Your symptoms are likely due to reflux. Please follow up with Dr. Fields/gastroenterology as scheduled. In the meantime, continue Protonix 40mg  daily as they prescribed.  Please obtain all of your results from medical records or have your doctors office obtain the results - share them with your doctor - you should be seen at your doctors office in the next 2 days. Call today to arrange your follow up. Take the medications as prescribed. Please review all of the medicines and only take them if you do not have an allergy to them. Please be aware that if you are taking birth control pills, taking other prescriptions, ESPECIALLY ANTIBIOTICS may make the birth control ineffective - if this is the case, either do not engage in sexual activity or use alternative methods of birth control such as condoms until you have finished the medicine and your family doctor says it is OK to restart them. If you are on a blood thinner such as COUMADIN, be aware that any other medicine that you take may cause the coumadin to either work too much, or not enough - you should have your coumadin level rechecked in next 7 days if this is the case.  ?  It is also a possibility that you have an allergic reaction to any of the medicines that you have been prescribed - Everybody reacts differently to medications and while MOST people have no trouble with most medicines, you may have a reaction such as nausea, vomiting, rash, swelling, shortness of breath. If this is the case, please stop taking the medicine immediately and contact your physician.  ?  You should return to the ER if you develop severe or worsening symptoms.

## 2015-01-17 NOTE — ED Notes (Signed)
Pt is in stable condition upon d/c and ambulates from ED. 

## 2015-01-17 NOTE — ED Notes (Signed)
C/o abd pain X1 pain, no V/D, no urinary symptoms, alert, ambulatory and in NAD

## 2015-01-30 ENCOUNTER — Ambulatory Visit (HOSPITAL_COMMUNITY)
Admission: RE | Admit: 2015-01-30 | Discharge: 2015-01-30 | Disposition: A | Discharge status: Home / Self Care | Payer: Self-pay | Source: Ambulatory Visit | Attending: Gastroenterology | Admitting: Gastroenterology

## 2015-01-30 ENCOUNTER — Encounter (HOSPITAL_COMMUNITY): Payer: Self-pay | Admitting: *Deleted

## 2015-01-30 ENCOUNTER — Encounter (HOSPITAL_COMMUNITY)
Admission: RE | Disposition: A | Discharge status: Home / Self Care | Payer: Self-pay | Source: Ambulatory Visit | Attending: Gastroenterology

## 2015-01-30 DIAGNOSIS — K5730 Diverticulosis of large intestine without perforation or abscess without bleeding: Secondary | ICD-10-CM | POA: Insufficient documentation

## 2015-01-30 DIAGNOSIS — Z1211 Encounter for screening for malignant neoplasm of colon: Secondary | ICD-10-CM | POA: Insufficient documentation

## 2015-01-30 DIAGNOSIS — Z809 Family history of malignant neoplasm, unspecified: Secondary | ICD-10-CM | POA: Insufficient documentation

## 2015-01-30 DIAGNOSIS — D125 Benign neoplasm of sigmoid colon: Secondary | ICD-10-CM | POA: Insufficient documentation

## 2015-01-30 DIAGNOSIS — Q438 Other specified congenital malformations of intestine: Secondary | ICD-10-CM | POA: Insufficient documentation

## 2015-01-30 DIAGNOSIS — D128 Benign neoplasm of rectum: Secondary | ICD-10-CM | POA: Insufficient documentation

## 2015-01-30 DIAGNOSIS — D123 Benign neoplasm of transverse colon: Secondary | ICD-10-CM | POA: Insufficient documentation

## 2015-01-30 HISTORY — PX: COLONOSCOPY: SHX5424

## 2015-01-30 SURGERY — COLONOSCOPY
Anesthesia: Moderate Sedation

## 2015-01-30 MED ORDER — STERILE WATER FOR IRRIGATION IR SOLN
Status: DC | PRN
  Administered 2015-01-30: 13:00:00

## 2015-01-30 MED ORDER — MEPERIDINE HCL 100 MG/ML IJ SOLN
INTRAMUSCULAR | Status: AC
  Filled 2015-01-30: qty 2

## 2015-01-30 MED ORDER — MEPERIDINE HCL 100 MG/ML IJ SOLN
INTRAMUSCULAR | Status: DC | PRN
  Administered 2015-01-30 (×3): 25 mg via INTRAVENOUS

## 2015-01-30 MED ORDER — MIDAZOLAM HCL 5 MG/5ML IJ SOLN
INTRAMUSCULAR | Status: AC
  Filled 2015-01-30: qty 10

## 2015-01-30 MED ORDER — MIDAZOLAM HCL 5 MG/5ML IJ SOLN
INTRAMUSCULAR | Status: DC | PRN
  Administered 2015-01-30 (×3): 2 mg via INTRAVENOUS

## 2015-01-30 MED ORDER — SODIUM CHLORIDE 0.9 % IV SOLN
INTRAVENOUS | Status: DC
  Administered 2015-01-30: 1000 mL via INTRAVENOUS

## 2015-01-30 NOTE — Discharge Instructions (Signed)
You had 3 polyps removed. You have small internal hemorrhoids and diverticulosis IN YOUR LEFT COLON.   FOLLOW A HIGH FIBER DIET. AVOID ITEMS THAT CAUSE BLOATING. SEE INFO BELOW.  YOUR BIOPSY RESULTS SHOULD BE BACK IN 7 DAYS.  Next colonoscopy in 5-10 years.    Colonoscopa: cuidados posteriores (Colonoscopy, Care After) Siga estas instrucciones durante las prximas semanas. Estas indicaciones le proporcionan informacin general acerca de cmo deber cuidarse despus del procedimiento. El mdico tambin podr darle instrucciones ms especficas. El tratamiento ha sido planificado segn las prcticas mdicas actuales, pero en algunos casos pueden ocurrir problemas. Comunquese con el mdico si tiene algn problema o tiene dudas despus del procedimiento. QU ESPERAR DESPUS DEL PROCEDIMIENTO  Despus del procedimiento, es comn tener las siguientes sensaciones:  Una pequea cantidad de sangre en la materia fecal.  Cantidades moderadas de gases e hinchazn o calambres abdominales leves. INSTRUCCIONES PARA EL CUIDADO EN EL HOGAR  No conduzca vehculos, opere maquinarias ni firme documentos importantes durante 24horas.  Puede ducharse y retomar sus actividades fsicas habituales, pero muvase a un ritmo ms lento durante las primeras 24horas.  Tmese descansos frecuentes durante las primeras 24horas.  Camine o colquese compresas calientes en el abdomen para ayudar a reducir los calambres e hinchazn abdominales.  Beba suficiente lquido para Consulting civil engineer orina clara o de color amarillo plido.  Puede retomar su dieta normal segn las instrucciones de su mdico. Evite los alimentos pesados o fritos que son difciles de Publishing copy.  Evite consumir alcohol durante 24horas o segn las instrucciones de su mdico.  Tome solo medicamentos de venta libre o recetados, segn las indicaciones del mdico.  Si se obtuvo una muestra de tejido (biopsia) durante el procedimiento:  No tome  aspirina ni anticoagulantes durante 7das, o segn las instrucciones de su mdico.  No consuma alcohol durante 7das o segn las instrucciones de su mdico.  Consuma alimentos livianos durante las primeras 24horas. SOLICITE ATENCIN MDICA SI: Tiene manchas persistentes de sangre en la materia fecal entre 2 y 3das posteriores al procedimiento. SOLICITE ATENCIN MDICA DE INMEDIATO SI:  Tiene ms que una pequea mancha de sangre en la materia fecal.  Elimina grandes cogulos de sangre en la materia fecal.  Tiene el abdomen hinchado (distendido).  Tiene nuseas o vmitos.  Tiene fiebre.  Siente dolor intenso en el abdomen que no se alivia con los [employer].  Plipos en el colon  (Colon Polyps) Los plipos son masas de tejido que crecen dentro del cuerpo. Los plipos pueden desarrollarse en el intestino grueso (colon). La mayora de los plipos son no cancerosos (benignos). Sin embargo, algunos plipos pueden convertirse en cancerosos con el tiempo. Los plipos que sean ms grandes que un guisante pueden ser [employer]. Para estar seguros, los mdicos extirpan y [employer] plipos.  CAUSAS  Se forman cuando ciertas mutaciones genticas hacen que las clulas se desarrollen y se dividan por dems.  FACTORES DE RIESGO  Hay un nmero de factores de riesgo que pueden aumentar las probabilidades de padecer plipos en el colon. Ellos son:   Ser mayor de 20 aos de edad.  Historia familiar de cncer o plipos de colon.  Ciertas enfermedades crnicas como la colitis o la enfermedad de Crohn.  Tener sobrepeso.  El hbito de fumar.  El sedentarismo.  Beber alcohol en exceso. SNTOMAS  La mayor parte de los plipos no causa sntomas. Si se presentan sntomas, stos pueden ser:   [employer] materia fecal. Heces de color rojo oscuro o  negro.  Constipacin o diarrea que duran ms de 1 semana. DIAGNSTICO  [place] persona con frecuencia no sabe que tiene plipos [employer] su  mdico los halla durante un examen fsico de [place]. El mdico puede usar 4 tipos de pruebas para Hydrographic surveyor plipos:   Examen Musician. Se colocar guantes y palpar el interior del recto. Esta prueba detectar solo plipos en el recto.  Enema de bario. El mdico introduce un lquido llamado bario en el recto y luego toma radiografas del colon. El bario hace que el colon se vea blanco. Los plipos son de color oscuro, por lo que son fciles de Chiropodist.  Sigmoideoscopia. Se coloca un tubo delgado y flexible (sigmoideoscopio) en el recto. Este sigmoideoscopio tiene [place] fuente de luz y Ardelia Mems pequea cmara de video. El mdico [place] el sigmoideoscopio para observar el ltimo tercio del colon.  Colonoscopa. Esta prueba es similar a la sigmoideoscopia, pero el mdico examina todo el colon. Este es el mtodo ms frecuente para Hydrographic surveyor y extirpar los plipos.  PREVENCIN  Para disminuir los riesgos de volver a Best boy plipos en el colon:   Coma mucha fruta y [place]. Evite las comidas grasas.  No fume.  Evite consumir alcohol.  [place].  Baje de peso segn las indicaciones de su mdico.  Consuma mucho clcio y folato. Las comidas que contienen calcio son la [place], los quesos y el brcoli. Las comidas que contienen folato son los garbanzos, los frijoles rojos y Nurse, mental health.  Dieta rica en fibra  (High-Fiber Diet)  La fibra, tambin llamada fibra dietaria, es un tipo de carbohidrato que se encuentra en las frutas, las verduras, los cereales integrales y los frijoles. Una dieta rica en fibra puede tener muchos beneficios para la salud. El mdico puede recomendar una dieta rica en fibra para ayudar a:  Contractor. La fibra puede hacer que defeque con ms frecuencia.  Disminuir el nivel de colesterol.  [place] hemorroides, la diverticulosis no complicada o el sndrome del intestino irritable.  Evitar comer en exceso como parte de un  plan para bajar de peso.  Evitar cardiopatas, la diabetes tipo 2 y ciertos cnceres. EN QU CONSISTE EL PLAN?  El consumo diario recomendado de fibra incluye lo siguiente:  12 gramos para hombres menores de 23 aos.  30 gramos para hombres mayores de 50 aos.  25 gramos para mujeres menores de 50 aos.  21 gramos para mujeres mayores de 50 aos. Puede lograr el consumo diario recomendado de fibra si come una variedad de frutas, verduras, cereales y frijoles. El mdico tambin puede recomendar un suplemento de fibra si no es posible obtener suficiente fibra a travs de la dieta.  QU DEBO SABER ACERCA DE [place]?  La eficacia de los suplementos de [place] no ha sido estudiada [place], de modo que es mejor obtener fibra a travs de los alimentos.  Verifique siempre el contenido de fibra en la etiqueta de informacin nutricional de los alimentos preenvasados. Busque alimentos que contengan al menos 5 gramos de fibra por porcin.  Consulte al nutricionista si tiene preguntas sobre algunos alimentos especficos relacionados con su enfermedad, especialmente si estos alimentos no se mencionan a continuacin.  Aumente el consumo diario de fibra en forma gradual. Aumentar demasiado rpido el consumo de fibra dietaria puede provocar meteorismo, clicos o gases.  Beber abundante agua. El [place] a Economist. QU ALIMENTOS PUEDO COMER?  Cereales  Panes  integrales. Multicereales. Avena. Arroz integral. Dwyane Luo. Trigo burgol. Mijo. Muffins de salvado. Palomitas de maz. Galletas de centeno.  Verduras  Batatas. [place]. Col rizada. [place]as. Repollo. Brcoli. Guisantes. Zanahorias. [place].  Frutas  Frutos rojos. Peras. Manzanas. Naranjas [place]. Ciruelas y pasas. Higos secos.  Carnes y otras fuentes de protenas  Frijoles blancos, colorados, pintos y porotos de soja. Guisantes secos. Lentejas. Frutos secos y semillas.  Lcteos  Yogur fortificado con Pharmacist, hospital.  Bebidas    Leche de soja fortificada con Fredderick Phenix. Jugo de naranja fortificado con Fredderick Phenix.  Otros  Barras de [place].  Los artculos mencionados arriba pueden no ser [employer] de las bebidas o los alimentos recomendados. Comunquese con el nutricionista para conocer ms opciones.  QU ALIMENTOS NO SE RECOMIENDAN?  Cereales  Pan blanco. Pastas hechas con Letitia Neri. Arroz blanco.  Verduras  Papas fritas. Verduras enlatadas. Verduras bien cocidas.  Frutas  Jugo de frutas. Frutas cocidas coladas.  Carnes y otras fuentes de protenas  Cortes de carne con Lobbyist. Aves o pescados fritos.  Lcteos  Leche. Yogur. Queso crema. [employer] Aid.  Bebidas  Gaseosas.  Otros  Tortas y pasteles. [place] y aceites.  Los artculos mencionados arriba pueden no ser [employer] de las bebidas y los alimentos que se Higher education careers adviser. Comunquese con el nutricionista para obtener ms informacin.  ALGUNOS CONSEJOS PARA INCLUIR ALIMENTOS RICOS EN FIBRA EN LA DIETA  Consuma una gran variedad de alimentos ricos en fibra.  Asegrese de que la mitad de todos los cereales consumidos cada da sean cereales integrales.  Reemplace los panes y cereales hechos de harina refinada o harina blanca por panes y cereales integrales.  Reemplace el arroz blanco por arroz integral, trigo burgol o mijo.  Comience Games developer con un desayuno rico en [place], como un cereal que contenga al menos 5 gramos de fibra por porcin.  Use guisantes en lugar de carne en las sopas, ensaladas o pastas.  Coma bocadillos ricos en fibra, como frutos rojos, verduras crudas, frutos secos o palomitas de maz.   Diverticulosis  (Diverticulosis)   La diverticulosis es una enfermedad que aparece cuando se forman pequeos bolsillos (divertculos) en las paredes del colon. El colon, o intestino grueso, es el lugar donde se absorbe agua y se forman las heces. Los bolsillos se forman cuando la capa interna del colon ejerce presin sobre los puntos dbiles  de las capas externas.  CAUSAS  Nadie sabe con exactitud qu causa la diverticulosis.  FACTORES DE RIESGO  Ser mayor de 69 aos. El riesgo de desarrollar esta enfermedad aumenta con la edad. La diverticulosis es poco frecuente en las personas menores de 83 aos. A los 80 aos, casi todas las personas tienen la enfermedad.  Comer una dieta con bajo contenido de [place].  Estar estreido con frecuencia.  Tener sobrepeso.  No hacer suficiente ejercicio fsico.  Fumar.  Tomar analgsicos de venta libre, como aspirina e ibuprofeno. SNTOMAS  La mayora de las personas que tienen diverticulosis no presentan sntomas.  DIAGNSTICO  Dado que la diverticulosis no suele causar sntomas, los mdicos a menudo descubren la enfermedad durante un examen de otros problemas de colon. En muchos casos, el mdico diagnosticar la diverticulosis mientras utiliza un endoscopio flexible para examinar el colon (colonoscopa).  TRATAMIENTO  Si nunca tuvo una infeccin relacionada con la diverticulosis, es posible que no necesite tratamiento. Si ha tenido una infeccin antes, el tratamiento puede incluir:  Comer ms frutas, verduras y cereales.  Tomar un suplemento de [place].  Tomar un suplemento de bacterias vivas (probitico).  Tomar medicamentos para relajar el colon. INSTRUCCIONES PARA EL CUIDADO EN EL HOGAR  Beba por lo menos entre 6 y 25 vasos de agua por da para Engineer, civil (consulting).  Trate de no hacer fuerza al mover el intestino.  Cumpla con todas las visitas de control. Si ha tenido una infeccin antes:  Aumente la cantidad de fibra en la dieta, segn las indicaciones del mdico o del nutricionista.  Tome un suplemento dietario con fibras si el mdico lo autoriza.  Tome los medicamentos solamente como se lo haya indicado el mdico. SOLICITE ATENCIN MDICA SI:  Siente dolor abdominal.  Tiene meteorismo.  Tiene clicos.  No ha defecado en 3 das. SOLICITE ATENCIN MDICA DE INMEDIATO SI:  El  dolor empeora.  El meteorismo [employer].  Tiene fiebre o escalofros, y los sntomas empeoran repentinamente.  Comienza a vomitar.  La materia fecal es sanguinolenta o negra. ASEGRESE DE QUE:  Comprende estas instrucciones.  Controlar su afeccin.  Recibir ayuda de inmediato si no mejora o si empeora.

## 2015-01-30 NOTE — Op Note (Signed)
[facility] [address] [place], [address]   COLONOSCOPY PROCEDURE REPORT  PATIENT: Alexandra Jennings, Alexandra Jennings  MR#: [id number] BIRTHDATE: 12-25-1960 , 54  yrs. old GENDER: female ENDOSCOPIST: Danie Binder, MD REFERRED [id number] Doreene Burke, M.D. PROCEDURE DATE:  Feb 01, 2015 PROCEDURE:   Colonoscopy with snare polypectomy INDICATIONS:average risk patient for colon cancer. MEDICATIONS: Demerol 100 mg IV and Versed 6 mg IV  DESCRIPTION OF PROCEDURE:    Physical exam was performed.  Informed consent was obtained from the patient after explaining the benefits, risks, and alternatives to procedure.  The patient was connected to monitor and placed in left lateral position. Continuous oxygen was provided by nasal cannula and IV medicine administered through an indwelling cannula.  After administration of sedation and rectal exam, the patients rectum was intubated and the [id number]  colonoscope was advanced under direct visualization to the cecum.  The scope was removed slowly by carefully examining the color, texture, anatomy, and integrity mucosa on the way out.  The patient was recovered in endoscopy and discharged home in satisfactory condition. Estimated blood loss is zero unless otherwise noted in this procedure report.    COLON FINDINGS: Three sessile polyps ranging from 5 to 32mm in size were found in the rectum, sigmoid colon, and at the hepatic flexure.  A polypectomy was performed using snare cautery.  , There was mild diverticulosis noted in the sigmoid colon and descending colon with associated muscular hypertrophy and tortuosity.  , and The colon was redundant.  Manual abdominal counter-pressure was used to reach the cecum.  PREP QUALITY: excellent.  CECAL W/D TIME: 17       minutes COMPLICATIONS: None  ENDOSCOPIC IMPRESSION: 1.   Three COLORECTAL Polyps REMOVED 2.   Mild diverticulosis n the sigmoid colon and descending colon 3.   The LEFT  colon IS REDUNDANT  RECOMMENDATIONS: HIGH FIBER DIET AWAIT BIOPSY NEXT TCS IN 5-10 YEARS   eSigned:  Danie Binder, MD 2015-02-01 3:17 PM   CPT CODES: ICD CODES:  The ICD and CPT codes recommended by this software are interpretations from the data that the clinical staff has captured with the software.  The verification of the translation of this report to the ICD and CPT codes and modifiers is the sole responsibility of the health care institution and practicing physician where this report was generated.  Central. will not be held responsible for the validity of the ICD and CPT codes included on this report.  AMA assumes no liability for data contained or not contained herein. CPT is a Designer, television/film set of the [employer].

## 2015-01-30 NOTE — H&P (Signed)
  Primary Care Physician:  Angelica Chessman, MD Primary Gastroenterologist:  Dr. Oneida Alar  Pre-Procedure History & Physical: HPI:  Alexandra Jennings is a 54 y.o. female here for [place].  No past medical history on file.  Past Surgical History  Procedure Laterality Date  . Tubal ligation      Prior to Admission medications   Medication Sig Start Date End Date Taking? Authorizing Provider  Na Sulfate-K Sulfate-Mg Sulf (SUPREP BOWEL PREP) SOLN Take 1 kit by mouth as directed. 01/15/15  Yes Danie Binder, MD  pantoprazole (PROTONIX) 40 MG tablet Take 1 tablet (40 mg total) by mouth daily. 12/21/14  Yes Tresa Garter, MD  ranitidine (ZANTAC) 150 MG capsule Take 150 mg by mouth 2 (two) times daily.   Yes Historical Provider, MD    Allergies as of 01/03/2015  . (No Known Allergies)    Family History  Problem Relation Age of Onset  . Cancer Mother   . Cancer Father     Social History   Social History  . Marital Status: Single    Spouse Name: N/A  . Number of Children: N/A  . Years of Education: N/A   Occupational History  . Not on file.   Social History Main Topics  . Smoking status: Never Smoker   . Smokeless tobacco: Not on file  . Alcohol Use: No  . Drug Use: No  . Sexual Activity: Not on file   Other Topics Concern  . Not on file   Social History Narrative    Review of Systems: See HPI, otherwise negative ROS   Physical Exam: BP 133/71 mmHg  Pulse 80  Temp(Src) 99 F (37.2 C) (Oral)  Resp 22  Ht $R'5\' 2"'Yu$  (1.575 m)  Wt 157 lb (71.215 kg)  BMI 28.71 kg/m2  SpO2 100%  LMP 04/05/2011 General:   Alert,  pleasant and cooperative in NAD Head:  Normocephalic and atraumatic. Neck:  Supple; Lungs:  Clear throughout to auscultation.    Heart:  Regular rate and rhythm. Abdomen:  Soft, nontender and nondistended. Normal bowel sounds, without guarding, and without rebound.   Neurologic:  Alert and  oriented x4;  grossly normal  neurologically.  Impression/Plan:     SCREENING  Plan:  1. TCS TODAY

## 2015-02-02 ENCOUNTER — Encounter (HOSPITAL_COMMUNITY): Payer: Self-pay | Admitting: Gastroenterology

## 2015-02-15 ENCOUNTER — Telehealth: Payer: Self-pay | Admitting: Gastroenterology

## 2015-02-15 NOTE — Telephone Encounter (Signed)
Please call pt. She had simple adenomas removed.  FOLLOW A HIGH FIBER DIET. AVOID ITEMS THAT CAUSE BLOATING & GAS.  Next colonoscopy in 3 years.  

## 2015-02-19 NOTE — Telephone Encounter (Signed)
Called pt and she could not understand [place]. Mailed the info and told her to call if she has questions.

## 2015-04-23 ENCOUNTER — Encounter: Payer: Self-pay | Admitting: Family Medicine

## 2015-04-23 ENCOUNTER — Ambulatory Visit: Payer: Self-pay | Attending: Family Medicine | Admitting: Family Medicine

## 2015-04-23 VITALS — BP 133/80 | HR 88 | Temp 99.0°F | Resp 15 | Ht 62.0 in | Wt 157.8 lb

## 2015-04-23 DIAGNOSIS — K219 Gastro-esophageal reflux disease without esophagitis: Secondary | ICD-10-CM | POA: Insufficient documentation

## 2015-04-23 DIAGNOSIS — J329 Chronic sinusitis, unspecified: Secondary | ICD-10-CM | POA: Insufficient documentation

## 2015-04-23 DIAGNOSIS — J328 Other chronic sinusitis: Secondary | ICD-10-CM

## 2015-04-23 DIAGNOSIS — R0982 Postnasal drip: Secondary | ICD-10-CM | POA: Insufficient documentation

## 2015-04-23 DIAGNOSIS — Z79899 Other long term (current) drug therapy: Secondary | ICD-10-CM | POA: Insufficient documentation

## 2015-04-23 MED ORDER — CETIRIZINE HCL 10 MG PO TABS: 10.0000 mg | ORAL_TABLET | Freq: Every day | ORAL | Status: DC

## 2015-04-23 NOTE — Progress Notes (Signed)
Subjective:  Patient ID: Alexandra Jennings, female    DOB: 22-Apr-1960  Age: 55 y.o. MRN: [id number]  CC: Gastroesophageal Reflux   HPI Alexandra Jennings is a 55 year old Hispanic female with a history of GERD seen with the aid of a video interpreter.  She complains of postnasal drip and "feeling a lump in the back of her throat which has been nasty flavor"for the last couple of weeks. She denies any sinus pressure , rhinorrhea or nasal congestion but admits to frequently having to clear her throat. Denies fever or myalgias.  At her last visit with her PCP she was placed on a PPI for GERD symptoms however she informs me today that the medications are ineffective and then goes on to say she does not have any abdominal pain or reflux either.  Outpatient Prescriptions Prior to Visit  Medication Sig Dispense Refill  . pantoprazole (PROTONIX) 40 MG tablet Take 1 tablet (40 mg total) by mouth daily. (Patient not taking: Reported on 04/23/2015) 90 tablet 3  . ranitidine (ZANTAC) 150 MG capsule Take 150 mg by mouth 2 (two) times daily. Reported on 04/23/2015     No facility-administered medications prior to visit.    ROS Review of Systems  Constitutional: Negative for activity change and appetite change.  HENT:       See history of present illness  Respiratory: Negative for chest tightness, shortness of breath and wheezing.   Cardiovascular: Negative for chest pain and palpitations.  Gastrointestinal: Negative for abdominal pain, constipation and abdominal distention.  Genitourinary: Negative.   Musculoskeletal: Negative.   Psychiatric/Behavioral: Negative for behavioral problems and dysphoric mood.    Objective:  BP 133/80 mmHg  Pulse 88  Temp(Src) 99 F (37.2 C)  Resp 15  Ht 5\' 2"  (1.575 m)  Wt 157 lb 12.8 oz (71.578 kg)  BMI 28.85 kg/m2  SpO2 98%  LMP 04/05/2011  BP/Weight 04/23/2015 01/30/2015 [id number]  Systolic BP [id number]  Diastolic BP 80 72 85  Wt. (Lbs)  157.8 157 -  BMI 28.85 28.71 -      Physical Exam  Constitutional: She is oriented to person, place, and time. She appears well-developed and well-nourished.  Cardiovascular: Normal rate, normal heart sounds and intact distal pulses.   No murmur heard. Pulmonary/Chest: Effort normal and breath sounds normal. She has no wheezes. She has no rales. She exhibits no tenderness.  Abdominal: Soft. Bowel sounds are normal. She exhibits no distension and no mass. There is no tenderness.  Musculoskeletal: Normal range of motion.  Neurological: She is alert and oriented to person, place, and time.     Assessment & Plan:   1. Gastroesophageal reflux disease without esophagitis She is asymptomatic and this time and requests discontinuation of her PPI which I have done.  2. Post-nasal drip - cetirizine (ZYRTEC) 10 MG tablet; Take 1 tablet (10 mg total) by mouth daily.  Dispense: 30 tablet; Refill: 2  3. Other chronic sinusitis Will hold off on initiating nasal sprays in the absence of nasal symptoms - cetirizine (ZYRTEC) 10 MG tablet; Take 1 tablet (10 mg total) by mouth daily.  Dispense: 30 tablet; Refill: 2   Meds ordered this encounter  Medications  . cetirizine (ZYRTEC) 10 MG tablet    Sig: Take 1 tablet (10 mg total) by mouth daily.    Dispense:  30 tablet    Refill:  2    Follow-up: Return in about 1 month (around 05/21/2015) for follow up on chronic sinusitis with PCP-  Dr Doreene Burke.   Arnoldo Morale MD

## 2015-04-23 NOTE — Patient Instructions (Signed)
Sinusitis en adultos (Sinusitis, Adult) La sinusitis es el enrojecimiento, el dolor y la inflamacin de los senos paranasales. Los senos paranasales son cavidades de aire que estn dentro de los huesos de la cara. Se encuentran debajo de los ojos, en la mitad de la frente y encima de los ojos. En los senos paranasales sanos, el moco puede drenar y el aire circula a travs de ellos en su camino hacia la Lawyer. Sin embargo, cuando se [place], el moco y el aire [place]. Esto hace que se desarrollen bacterias y otros grmenes que causan infeccin. La sinusitis puede desarrollarse rpidamente y durar solo un tiempo corto (aguda) o continuar por un perodo largo (crnica). La sinusitis que dura ms de 12 semanas se considera crnica. [place] causas de la sinusitis son:  [place].  Las [employer], como el desplazamiento del cartlago que separa las fosas nasales (desvo del tabique), que puede disminuir el flujo de aire por la nariz y los senos paranasales, y Print production planner su drenaje.  Las anomalas funcionales, como cuando los pequeos pelos (cilias) que se encuentran en los senos paranasales y ayudan a eliminar el moco no funcionan correctamente o no estn presentes. [place] sntomas de la sinusitis aguda y [place] son los mismos. Los sntomas principales son Conservation officer, historic buildings y la presin alrededor de los senos paranasales afectados. Otros sntomas son:  Chief of Staff.  Dolor de odos.  Dolor de [place].  Mal aliento.  Disminucin del sentido del olfato y del gusto.  Tos, que empeora al [employer].  Fatiga.  Cristy Hilts.  Drenaje de moco espeso por la nariz, que generalmente es de color verde y puede contener pus (purulento).  Hinchazn y calor en los senos paranasales afectados. DIAGNSTICO Su mdico le Chartered certified accountant examen fsico. Durante el examen, el mdico puede hacer cualquiera de estas cosas para determinar si usted tiene sinusitis aguda o  crnica:  Le revisar la nariz para buscar signos de crecimientos anormales en las fosas nasales (plipos nasales).  Palpar los senos paranasales afectados para buscar signos de infeccin.  Observar la parte interna de los senos paranasales con un dispositivo que tiene una luz (endoscopio). Si el mdico sospecha que usted sufre sinusitis crnica, podr indicar una o ms de las siguientes pruebas:  Pruebas de Buyer, retail.  Cultivo de las [employer]. Se extrae Truddie Coco de moco de la nariz, que se enva al laboratorio para detectar bacterias.  Citologa nasal. Se extrae Truddie Coco de moco de la nariz, que el mdico examina para determinar si la sinusitis est relacionada con Obie Dredge. TRATAMIENTO La mayora de los casos de sinusitis aguda se deben a una infeccin viral y se resuelven espontneamente en un perodo de 10das. A veces, se recetan medicamentos como ayuda para [employer] sntomas tanto de la sinusitis aguda como de la crnica. Estos pueden incluir analgsicos, descongestivos, aerosoles nasales con corticoides o aerosoles de solucin salina. Sin embargo, para la sinusitis por infeccin bacteriana, Camera operator. Los antibiticos son medicamentos que destruyen las bacterias que causan la infeccin. Con poca frecuencia, la sinusitis tiene su origen en una infeccin por hongos. En estos casos, Designer, television/film set un medicamento antimictico. Para algunos casos de sinusitis crnica, es necesario someterse a [place]. Generalmente, se trata de [employer] la sinusitis se repite ms de 3veces al ao, a pesar de otros tratamientos. INSTRUCCIONES PARA EL CUIDADO EN EL HOGAR  Beber abundante agua. Los lquidos ayudan a Radiation protection practitioner  el moco, para que drene ms fcilmente de los senos paranasales.  Use un humidificador.  Inhale vapor de 3a 4veces al da (por ejemplo, sintese en el bao con la [place]).  Aplquese un pao tibio y hmedo en  la cara 3 o 4veces [place] indicaciones del mdico.  Use un aerosol nasal salino para ayudar a Air cabin crew y [employer] senos nasales.  Tome los medicamentos solamente como se lo haya indicado el mdico.  Si le recetaron un antimictico o un antibitico, asegrese de terminarlos, incluso si comienza a sentirse mejor. SOLICITE ATENCIN MDICA DE INMEDIATO SI:  Siente ms dolor o sufre dolores de cabeza intensos.  Tiene nuseas, vmitos o somnolencia.  Observa hinchazn alrededor del rostro.  Tiene problemas de visin.  Presenta rigidez en el cuello.  Tiene dificultad para respirar.   Esta informacin no tiene Marine scientist el consejo del mdico. Asegrese de hacerle al mdico cualquier pregunta que tenga.   Document Released: 11/27/2004 Document Revised: 03/10/2014 Elsevier Interactive Patient Education [employer].

## 2015-04-23 NOTE — Progress Notes (Signed)
Patient states she came to doctor in October who gave her Protonix for her GERD She is currently taking no medications but states the medication did not help

## 2015-05-24 ENCOUNTER — Encounter: Payer: Self-pay | Admitting: Internal Medicine

## 2015-05-24 ENCOUNTER — Encounter: Payer: Self-pay | Admitting: Clinical

## 2015-05-24 ENCOUNTER — Ambulatory Visit: Payer: Self-pay | Attending: Internal Medicine | Admitting: Internal Medicine

## 2015-05-24 VITALS — BP 147/71 | HR 83 | Temp 98.1°F | Resp 18 | Ht 62.0 in | Wt 162.2 lb

## 2015-05-24 DIAGNOSIS — R0982 Postnasal drip: Secondary | ICD-10-CM | POA: Insufficient documentation

## 2015-05-24 DIAGNOSIS — J4530 Mild persistent asthma, uncomplicated: Secondary | ICD-10-CM | POA: Insufficient documentation

## 2015-05-24 DIAGNOSIS — Z1239 Encounter for other screening for malignant neoplasm of breast: Secondary | ICD-10-CM | POA: Insufficient documentation

## 2015-05-24 DIAGNOSIS — J328 Other chronic sinusitis: Secondary | ICD-10-CM

## 2015-05-24 DIAGNOSIS — J45909 Unspecified asthma, uncomplicated: Secondary | ICD-10-CM | POA: Insufficient documentation

## 2015-05-24 DIAGNOSIS — Z79899 Other long term (current) drug therapy: Secondary | ICD-10-CM | POA: Insufficient documentation

## 2015-05-24 DIAGNOSIS — J329 Chronic sinusitis, unspecified: Secondary | ICD-10-CM | POA: Insufficient documentation

## 2015-05-24 MED ORDER — ALBUTEROL SULFATE HFA 108 (90 BASE) MCG/ACT IN AERS: 2.0000 | INHALATION_SPRAY | Freq: Four times a day (QID) | RESPIRATORY_TRACT | Status: DC | PRN

## 2015-05-24 MED ORDER — FLUTICASONE PROPIONATE 50 MCG/ACT NA SUSP: 2.0000 | Freq: Every day | NASAL | Status: DC

## 2015-05-24 MED ORDER — ALBUTEROL SULFATE HFA 108 (90 BASE) MCG/ACT IN AERS
2.0000 | INHALATION_SPRAY | Freq: Four times a day (QID) | RESPIRATORY_TRACT | Status: DC | PRN
Start: 2015-05-24 — End: 2017-08-28

## 2015-05-24 NOTE — Progress Notes (Signed)
Patient is here for Chronic Sinusitis.  Patient complains of sinus concerns for "awhile"  Patient denies pain at this time.  Patient is indecisive about the flu shot.

## 2015-05-24 NOTE — Progress Notes (Signed)
Patient ID: Alexandra Jennings, female   DOB: November 04, 1960, 55 y.o.   MRN: [id number]   Alexandra Jennings, is a 55 y.o. female  [id number]  [id number]  DOB - 1960-04-02  Chief Complaint  Patient presents with  . Sinusitis        Subjective:   Alexandra Jennings is a 55 y.o. female here today for a follow up visit. She is currently today because of ongoing postnasal drip and feeling of lump in the back of her throat. She said the medication given to her recently and is not working. She thinks all the symptoms are coming from chronic sinusitis. She has no fever at this time. No cough. No pain. She has not had her flu shot for this year. She is due for mammogram. She really does not have any symptoms today. Patient has No headache, No chest pain, No abdominal pain - No Nausea, No new weakness tingling or numbness, No Cough - SOB.  Problem  Chronic Sinusitis  Reactive Airway Disease  Screening for Breast Cancer    ALLERGIES: No Known Allergies  PAST MEDICAL HISTORY: History reviewed. No pertinent past medical history.  MEDICATIONS AT HOME: Prior to Admission medications   Medication Sig Start Date End Date Taking? Authorizing Provider  cetirizine (ZYRTEC) 10 MG tablet Take 1 tablet (10 mg total) by mouth daily. 04/23/15  Yes Arnoldo Morale, MD  albuterol (PROVENTIL HFA;VENTOLIN HFA) 108 (90 Base) MCG/ACT inhaler Inhale 2 puffs into the lungs every 6 (six) hours as needed for wheezing or shortness of breath. 05/24/15   Tresa Garter, MD     Objective:   Filed Vitals:   05/24/15 1018  BP: 147/71  Pulse: 83  Temp: 98.1 F (36.7 C)  TempSrc: Oral  Resp: 18  Height: 5\' 2"  (1.575 m)  Weight: 162 lb 3.2 oz (73.573 kg)  SpO2: 99%    Exam General appearance : Awake, alert, not in any distress. Speech Clear. Not toxic looking HEENT: Atraumatic and Normocephalic, pupils equally reactive to light and accomodation Neck: supple, no JVD. No cervical lymphadenopathy.   Chest:Good air entry bilaterally, no added sounds  CVS: S1 S2 regular, no murmurs.  Abdomen: Bowel sounds present, Non tender and not distended with no gaurding, rigidity or rebound. Extremities: B/L Lower Ext shows no edema, both legs are warm to touch Neurology: Awake alert, and oriented X 3, CN II-XII intact, Non focal  Data Review Lab Results  Component Value Date   HGBA1C 5.50 03/16/2014   HGBA1C 5.4 04/04/2013     Assessment & Plan   1. Post-nasal drip Continue antihistamine Symptomatic treatment emphasized  Refill albuterol inhaler for reactive airway disease - albuterol (PROVENTIL HFA;VENTOLIN HFA) 108 (90 Base) MCG/ACT inhaler; Inhale 2 puffs into the lungs every 6 (six) hours as needed for wheezing or shortness of breath.  Dispense: 1 Inhaler; Refill: 2  2. Reactive airway disease, mild persistent, uncomplicated  - albuterol (PROVENTIL HFA;VENTOLIN HFA) 108 (90 Base) MCG/ACT inhaler; Inhale 2 puffs into the lungs every 6 (six) hours as needed for wheezing or shortness of breath.  Dispense: 1 Inhaler; Refill: 2  3. Screening for breast cancer  - MM Digital Screening; Future  Patient have been counseled extensively about nutrition and exercise  Return in about 6 months (around 11/24/2015) for RAD, Pap Smear.  Interpreter was used to communicate directly with patient for the entire encounter including providing detailed patient instructions.   The patient was given clear instructions to go to ER or return to  medical center if symptoms don't improve, worsen or new problems develop. The patient verbalized understanding. The patient was told to call to get lab results if they haven't heard anything in the next week.   This note has been created with Surveyor, quantity. Any transcriptional errors are unintentional.    Angelica Chessman, MD, [place], Karilyn Cota, [place] and [facility] [place],  [place]   05/24/2015, 11:21 AM

## 2015-05-24 NOTE — Progress Notes (Signed)
  Depression screen [facility] 2/9 05/24/2015 04/23/2015 12/21/2014 12/21/2014 04/04/2013  Decreased Interest 1 0 1 0 1  Down, Depressed, Hopeless 0 0 1 0 1  PHQ - 2 Score 1 0 2 0 2  Altered sleeping 0 - 0 - 0  Tired, decreased energy 1 - 1 - 0  Change in appetite 0 - 0 - 0  Feeling bad or failure about yourself  0 - 0 - 0  Trouble concentrating 1 - 1 - 0  Moving slowly or fidgety/restless 0 - 0 - 0  Suicidal thoughts 0 - 0 - 0  PHQ-9 Score 3 - 4 - 2

## 2015-05-25 ENCOUNTER — Other Ambulatory Visit: Payer: Self-pay | Admitting: Internal Medicine

## 2015-05-25 DIAGNOSIS — Z1231 Encounter for screening mammogram for malignant neoplasm of breast: Secondary | ICD-10-CM

## 2015-05-28 ENCOUNTER — Ambulatory Visit: Payer: Self-pay | Attending: Internal Medicine

## 2015-06-18 ENCOUNTER — Ambulatory Visit
Admission: RE | Admit: 2015-06-18 | Discharge: 2015-06-18 | Disposition: A | Discharge status: Home / Self Care | Payer: No Typology Code available for payment source | Source: Ambulatory Visit | Attending: Internal Medicine | Admitting: Internal Medicine

## 2015-06-18 DIAGNOSIS — Z1231 Encounter for screening mammogram for malignant neoplasm of breast: Secondary | ICD-10-CM

## 2015-07-11 ENCOUNTER — Telehealth: Payer: Self-pay | Admitting: *Deleted

## 2015-07-11 NOTE — Telephone Encounter (Signed)
Medical Assistant used [employer] to contact patient.  Interpreter Name: Berle Mull #: [id number]  Medical Assistant left message on patient's home and cell voicemail. Voicemail states to give a call back to [place] with [facility] at [phone].

## 2015-07-11 NOTE — Telephone Encounter (Signed)
-----   Message from Tresa Garter, MD sent at 06/19/2015  8:16 AM EDT ----- Please inform patient that her screening mammogram shows no evidence of malignancy. Recommend screening mammogram in one year

## 2015-08-06 ENCOUNTER — Telehealth: Payer: Self-pay | Admitting: Internal Medicine

## 2015-08-06 DIAGNOSIS — Z1384 Encounter for screening for dental disorders: Secondary | ICD-10-CM

## 2015-08-06 NOTE — Telephone Encounter (Signed)
Patient would like to be referred to see the Dentist. Patient has the orange card. Please follow up.

## 2015-08-23 NOTE — Telephone Encounter (Signed)
Referral has been placed for dentistry

## 2016-10-27 ENCOUNTER — Ambulatory Visit: Payer: Self-pay | Attending: Internal Medicine

## 2017-08-28 ENCOUNTER — Encounter: Payer: Self-pay | Admitting: Nurse Practitioner

## 2017-08-28 ENCOUNTER — Ambulatory Visit: Payer: Self-pay | Attending: Nurse Practitioner | Admitting: Nurse Practitioner

## 2017-08-28 VITALS — BP 160/87 | HR 58 | Temp 98.9°F | Ht 62.0 in | Wt 179.6 lb

## 2017-08-28 DIAGNOSIS — R1030 Lower abdominal pain, unspecified: Secondary | ICD-10-CM

## 2017-08-28 DIAGNOSIS — Z9889 Other specified postprocedural states: Secondary | ICD-10-CM | POA: Insufficient documentation

## 2017-08-28 DIAGNOSIS — Z0000 Encounter for general adult medical examination without abnormal findings: Secondary | ICD-10-CM

## 2017-08-28 DIAGNOSIS — Z809 Family history of malignant neoplasm, unspecified: Secondary | ICD-10-CM | POA: Insufficient documentation

## 2017-08-28 DIAGNOSIS — Z79899 Other long term (current) drug therapy: Secondary | ICD-10-CM | POA: Insufficient documentation

## 2017-08-28 DIAGNOSIS — I10 Essential (primary) hypertension: Secondary | ICD-10-CM

## 2017-08-28 MED ORDER — HYDROCHLOROTHIAZIDE 25 MG PO TABS: 25.0000 mg | ORAL_TABLET | Freq: Every day | ORAL | 3 refills | Status: DC

## 2017-08-28 MED FILL — HYDROCHLOROTHIAZIDE 25 MG T: 25 | 30 days supply | Qty: 30 | Fill #0

## 2017-08-28 NOTE — Progress Notes (Signed)
Assessment & Plan:  Alexandra Jennings was seen today for establish care.  Diagnoses and all orders for this visit:  Essential hypertension -     hydrochlorothiazide (HYDRODIURIL) 25 MG tablet; Take 1 tablet (25 mg total) by mouth daily. Continue all antihypertensives as prescribed.  Remember to bring in your blood pressure log with you for your follow up appointment.  DASH/Mediterranean Diets are healthier choices for HTN.   Lower abdominal pain Continue to monitor at this time CBC CMP with EGFR  Routine adult health maintenance -     CBC -     CMP14+EGFR -     Hemoglobin A1c -     Lipid panel -     Hepatitis C Antibody   Patient has been counseled on age-appropriate routine health concerns for screening and prevention. These are reviewed and up-to-date. Referrals have been placed accordingly. Immunizations are up-to-date or declined.    Subjective:   Chief Complaint  Patient presents with  . Establish Care    Pt. is here to establish care. Pt. stated lately she have pain on her belly, and only when she is having a bowel movement. Pt. stated her bowel movement are normal.    HPI Alexandra Jennings 57 y.o. female presents to office today to establish care. VRI was used to communicate directly with patient for the entire encounter including providing detailed patient instructions. She has complaints of abdominal pain today. Blood pressure is elevated today . Will start her on HCTZ '25mg'$  today.   Abdominal Pain Patient complains of abdominal pain in the mornings. She has a BM and then the pain goes away.  She has at least 1-2 normal BMs a day. The pain is described as aching, colicky and cramping, and is 4/10 in intensity. Pain is located in the suprapubic without radiation. Onset was 2 months ago. Symptoms have been gradually improving since she started eating papaya. Aggravating factors: none.   Associated symptoms: none. The patient denies constipation, diarrhea, dysuria and  fever.    Review of Systems  Constitutional: Negative for fever, malaise/fatigue and weight loss.  HENT: Negative.  Negative for nosebleeds.   Eyes: Negative.  Negative for blurred vision, double vision and photophobia.  Respiratory: Negative.  Negative for cough and shortness of breath.   Cardiovascular: Negative.  Negative for chest pain, palpitations and leg swelling.  Gastrointestinal: Negative.  Negative for heartburn, nausea and vomiting.  Musculoskeletal: Negative.  Negative for myalgias.  Neurological: Negative.  Negative for dizziness, focal weakness, seizures and headaches.  Psychiatric/Behavioral: Negative.  Negative for suicidal ideas.    History reviewed. No pertinent past medical history.  Past Surgical History:  Procedure Laterality Date  . COLONOSCOPY N/A 01/30/2015   Procedure: COLONOSCOPY;  Surgeon: Danie Binder, MD;  Location: [facility];  Service: Endoscopy;  Laterality: N/A;  1300   . TUBAL LIGATION      Family History  Problem Relation Age of Onset  . Cancer Mother   . Cancer Father     Social History Reviewed with no changes to be made today.   Outpatient Medications Prior to Visit  Medication Sig Dispense Refill  . albuterol (PROVENTIL HFA;VENTOLIN HFA) 108 (90 Base) MCG/ACT inhaler Inhale 2 puffs into the lungs every 6 (six) hours as needed for wheezing or shortness of breath. (Patient not taking: Reported on 08/28/2017) 1 Inhaler 2  . cetirizine (ZYRTEC) 10 MG tablet Take 1 tablet (10 mg total) by mouth daily. (Patient not taking: Reported on 08/28/2017) 30  tablet 2  . fluticasone (FLONASE) 50 MCG/ACT nasal spray Place 2 sprays into both nostrils daily. (Patient not taking: Reported on 08/28/2017) 16 g 6   No facility-administered medications prior to visit.     No Known Allergies     Objective:    BP (!) 160/87 (BP Location: Right Arm, Patient Position: Sitting, Cuff Size: Normal)   Pulse (!) 58   Temp 98.9 F (37.2 C) (Oral)   Ht 5' 2"  (1.575 m)   Wt 179 lb 9.6 oz (81.5 kg)   LMP 04/05/2011   SpO2 97%   BMI 32.85 kg/m  Wt Readings from Last 3 Encounters:  08/28/17 179 lb 9.6 oz (81.5 kg)  05/24/15 162 lb 3.2 oz (73.6 kg)  04/23/15 157 lb 12.8 oz (71.6 kg)    Physical Exam  Constitutional: She is oriented to person, place, and time. She appears well-developed and well-nourished. She is cooperative.  HENT:  Head: Normocephalic and atraumatic.  Eyes: EOM are normal.  Neck: Normal range of motion.  Cardiovascular: Normal rate, regular rhythm and normal heart sounds. Exam reveals no gallop and no friction rub.  No murmur heard. Pulmonary/Chest: Effort normal and breath sounds normal. No tachypnea. No respiratory distress. She has no decreased breath sounds. She has no wheezes. She has no rhonchi. She has no rales. She exhibits no tenderness.  Abdominal: Soft. Bowel sounds are normal. She exhibits no distension and no mass. There is no tenderness. There is no rebound and no guarding. No hernia.  Musculoskeletal: Normal range of motion. She exhibits no edema.  Neurological: She is alert and oriented to person, place, and time. Coordination normal.  Skin: Skin is warm and dry.  Psychiatric: She has a normal mood and affect. Her behavior is normal. Judgment and thought content normal.  Nursing note and vitals reviewed.      Patient has been counseled extensively about nutrition and exercise as well as the importance of adherence with medications and regular follow-up. The patient was given clear instructions to go to ER or return to medical center if symptoms don't improve, worsen or new problems develop. The patient verbalized understanding.   Follow-up: Return for 2 weeks BP recheck/NURSE and then schedule Physical and PAP for another day.   [name], FNP-BC [facility] and [place], [place]   08/28/2017, 1:08 PM

## 2017-08-29 LAB — CMP14+EGFR
ALT: 45 IU/L — ABNORMAL HIGH (ref 0–32)
AST: 30 IU/L (ref 0–40)
Albumin/Globulin Ratio: 2.1 (ref 1.2–2.2)
Albumin: 4.8 g/dL (ref 3.5–5.5)
Alkaline Phosphatase: 78 IU/L (ref 39–117)
BILIRUBIN TOTAL: 0.3 mg/dL (ref 0.0–1.2)
BUN/Creatinine Ratio: 20 (ref 9–23)
BUN: 12 mg/dL (ref 6–24)
CHLORIDE: 106 mmol/L (ref 96–106)
CO2: 22 mmol/L (ref 20–29)
Calcium: 9.4 mg/dL (ref 8.7–10.2)
Creatinine, Ser: 0.6 mg/dL (ref 0.57–1.00)
GFR calc non Af Amer: 103 mL/min/{1.73_m2} (ref >59)
GFR, EST AFRICAN AMERICAN: 119 mL/min/{1.73_m2} (ref >59)
GLUCOSE: 85 mg/dL (ref 65–99)
Globulin, Total: 2.3 g/dL (ref 1.5–4.5)
Potassium: 4.3 mmol/L (ref 3.5–5.2)
Sodium: 142 mmol/L (ref 134–144)
TOTAL PROTEIN: 7.1 g/dL (ref 6.0–8.5)

## 2017-08-29 LAB — LIPID PANEL
Chol/HDL Ratio: 4.5 ratio — ABNORMAL HIGH (ref 0.0–4.4)
Cholesterol, Total: 189 mg/dL (ref 100–199)
HDL: 42 mg/dL (ref >39)
LDL Calculated: 118 mg/dL — ABNORMAL HIGH (ref 0–99)
Triglycerides: 146 mg/dL (ref 0–149)
VLDL Cholesterol Cal: 29 mg/dL (ref 5–40)

## 2017-08-29 LAB — CBC
Hematocrit: 41.9 % (ref 34.0–46.6)
Hemoglobin: 14.1 g/dL (ref 11.1–15.9)
MCH: 32.1 pg (ref 26.6–33.0)
MCHC: 33.7 g/dL (ref 31.5–35.7)
MCV: 95 fL (ref 79–97)
PLATELETS: 234 10*3/uL (ref 150–450)
RBC: 4.39 x10E6/uL (ref 3.77–5.28)
RDW: 13.3 % (ref 12.3–15.4)
WBC: 5.6 10*3/uL (ref 3.4–10.8)

## 2017-08-29 LAB — HEMOGLOBIN A1C
ESTIMATED AVERAGE GLUCOSE: 117 mg/dL
HEMOGLOBIN A1C: 5.7 % — AB (ref 4.8–5.6)

## 2017-08-29 LAB — HEPATITIS C ANTIBODY

## 2017-09-04 ENCOUNTER — Telehealth: Payer: Self-pay

## 2017-09-04 NOTE — Telephone Encounter (Signed)
-----   Message from [name], NP sent at 08/30/2017  1:55 PM EDT ----- Labs showing prediabetes and LDL cholesterol is elevated. Patient should work on a low fat, heart healthy diet and participate in regular aerobic exercise program to control as well by working out at least 150 minutes per week. No red meat, white rice, white potatoes, beans, white bread/tortillas or fried foods. No junk foods, sodas, sugary drinks, unhealthy snacking, or smoking. Eat more lean meats such as chicken and [place] or salmon and fish as well as vegetables and fruit.

## 2017-09-04 NOTE — Telephone Encounter (Signed)
CMA spoke to patient to inform on lab results and advising.  Pt. Understood. Pt. Verified DOB.  Spanish interpreter Kittie Plater [phone] assist with the call.

## 2017-09-11 ENCOUNTER — Ambulatory Visit: Payer: Self-pay | Attending: Nurse Practitioner | Admitting: Pharmacist

## 2017-09-11 ENCOUNTER — Encounter: Payer: Self-pay | Admitting: Pharmacist

## 2017-09-11 VITALS — BP 129/86 | HR 108

## 2017-09-11 DIAGNOSIS — I10 Essential (primary) hypertension: Secondary | ICD-10-CM | POA: Insufficient documentation

## 2017-09-11 NOTE — Progress Notes (Signed)
   S:    PCP: Geryl Rankins  Alexandra Jennings is a 57 yo female who arrives today for a BP re-check at the request of PCP. VRI used to communicate directly with patient for the entire encounter including providing patient instructions. Pt states that she is a little anxious today. She was last seen by PCP on 08/28/17.   Pt denies dizziness, chest pain, palpitations, HA.   Reports adherence with HCTZ but she is taking 1/2 of the 25mg  tablet daily instead of 1 tablet d/t dizziness with the full tablet dose. She states that she has been doing this for about a week now.   Current BP Medications include:   - HCTZ 25 mg daily  Antihypertensives tried in the past include:  - None  Dietary habits include:  - Patient makes juices/blends from fresh vegetables and fruits. Those noted during visit: carrot, apple, beets. She is limiting salt and red meat. She is limiting coffee.  Exercise habits include:  - Pt states walking for 30 minutes per day for the past 10 days Family / Social history:  - Never smoker - Denies alcohol use - Mother and Father deceased: unspecified cancer  ASCVD risk factors include:  - calculated 10 yr = ~3%   Home BP readings:  - Does not check at home  O:   Office BP reading after 5 minutes rest: 129/86.   Last 3 Office BP readings: BP Readings from Last 3 Encounters:  08/28/17 (!) 160/87  05/24/15 (!) 147/71  04/23/15 133/80    BMET    Component Value Date/Time   NA 142 08/28/2017 1216   K 4.3 08/28/2017 1216   CL 106 08/28/2017 1216   CO2 22 08/28/2017 1216   GLUCOSE 85 08/28/2017 1216   GLUCOSE 117 (H) 01/17/2015 1450   BUN 12 08/28/2017 1216   CREATININE 0.60 08/28/2017 1216   CREATININE 0.54 03/16/2014 1656   CALCIUM 9.4 08/28/2017 1216   GFRNONAA 103 08/28/2017 1216   GFRNONAA >89 03/16/2014 1656   GFRAA 119 08/28/2017 1216   GFRAA >89 03/16/2014 1656    Renal function: CrCl cannot be calculated (Unknown ideal  weight.).  A/P: Hypertension newly diagnosed currently uncontrolled but improving on HCTZ. BP Goal < 130/80 mmHg. Patient is adherent with her medication but does endorse dizziness and "feeling bad" on 25 mg daily. She reports that she reduced to 1/2 a pill a day and dizziness improved. She does not currently feel dizzy and denies dizziness for the last week since switching to 1/2 tablet daily. She is anxious today. I allowed her to rest in an upright, seated position for 5 minutes before checking BP. Pt is motivated to change diet and increase physical activity to achieve better control.   Recommend to continue with HCTZ 12.5 mg daily d/t BP being near goal and dizziness eliminated with dose decrease. Of note, patient is pre-diabetic and thiazides can cause glucose levels to increase. Recommend to follow glucose and switch to another anti-hypertensive class should glycemic control lessen over time. Last glucose was 85 but pt has prediabetic A1c of 5.7.   -Continued HCTZ 12.5 mg daily.  -Counseled on lifestyle modifications for blood pressure control including reduced dietary sodium, increased exercise, adequate sleep  Results reviewed and written information provided.   Total time in face-to-face counseling 30 minutes.   F/U Clinic Visit in 09/28/17.   Benard Halsted, PharmD, [place] [phone]

## 2017-09-11 NOTE — Patient Instructions (Signed)
Thank you for coming to see me today. Please continue your blood pressure medication. If you need a refill, please call the pharmacy.   You can continue taking 1/2 tablet. I will let your doctor know.    Plan de alimentacin DASH DASH Eating Plan DASH es la sigla en ingls de "Enfoques Alimentarios para Detener la Hipertensin" (Dietary Approaches to Stop Hypertension). El plan de alimentacin DASH ha demostrado bajar la presin arterial elevada (hipertensin). Tambin puede reducir UnitedHealth de diabetes tipo 2, enfermedad cardaca y accidente cerebrovascular. Este plan tambin puede ayudar a Horticulturist, commercial. Consejos para seguir este plan Pautas generales  Evite ingerir ms de 2,300 mg (miligramos) de sal (sodio) por da. Si tiene hipertensin, es posible que necesite reducir la ingesta de sodio a 1,500 mg por da.  Limite el consumo de alcohol a no ms de 27medida por da si es mujer y no est [place], y 13medidas por da si es hombre. Una medida equivale a 12oz (359ml) de cerveza, 5oz (162ml) de vino o 1oz (80ml) de bebidas alcohlicas de alta graduacin.  Trabaje con su mdico para mantener un peso saludable o perder [employer]. Pregntele cul es el peso recomendado para usted.  Realice al menos 30 minutos de ejercicio que haga que se acelere su corazn (ejercicio Arboriculturist) la [employer] de la [place]. Estas actividades pueden incluir caminar, nadar o andar en bicicleta.  Trabaje con su mdico o especialista en alimentacin y nutricin (nutricionista) para ajustar su plan alimentario a sus necesidades calricas personales. Lectura de las etiquetas de los alimentos  Verifique en las etiquetas de los alimentos, la cantidad de sodio por porcin. Elija alimentos con menos del 5 por ciento del valor diario de sodio. Generalmente, los alimentos con menos de 300 mg de sodio por porcin se encuadran dentro de este plan alimentario.  Para encontrar cereales integrales, busque la palabra  "integral" como primera palabra en la lista de ingredientes. De compras  Compre productos en los que en su etiqueta diga: "bajo contenido de sodio" o "sin agregado de sal".  Compre alimentos frescos. Evite los alimentos enlatados y comidas precocidas o congeladas. Coccin  Evite agregar sal cuando cocine. Use hierbas o aderezos sin sal, en lugar de sal de mesa o sal marina. Consulte al mdico o farmacutico antes de usar sustitutos de la sal.  No fra los alimentos. A la hora de cocinar los alimentos opte por hornearlos, hervirlos, grillarlos y asarlos a Administrator, arts.  Cocine con aceites cardiosaludables, como oliva, canola, soja o girasol. Planificacin de las comidas   Consuma una dieta equilibrada, que incluya lo siguiente: ? 5o ms porciones de frutas y Set designer. Trate de que la mitad del plato de cada comida sean frutas y verduras. ? Hasta 6 u 8 porciones de cereales integrales por da. ? Menos de 6 onzas de carne, aves o pescado Games developer. Una porcin de 3 onzas de carne tiene casi el mismo tamao que un mazo de cartas. Un huevo equivale a 1 onza. ? Dos porciones de productos lcteos descremados por Training and development officer. ? Una porcin de frutos secos, semillas o frijoles 5 veces por semana. ? Grasas cardiosaludables. Las grasas saludables llamadas cidos grasos omega-3 se encuentran en alimentos como semillas de lino y pescados de agua fra, como por ejemplo, sardinas, salmn y caballa.  Limite la cantidad que ingiere de los siguientes alimentos: ? Alimentos enlatados o envasados. ? Alimentos con alto contenido de grasa trans, como alimentos fritos. ? Alimentos con  alto contenido de [place], como carne con grasa. ? Dulces, postres, bebidas azucaradas y otros alimentos con azcar agregada. ? Productos lcteos enteros.  No le agregue sal a los alimentos antes de probarlos.  Trate de comer al menos 2 comidas vegetarianas por semana.  Consuma ms comida casera y menos de  restaurante, de bufs y comida rpida.  Cuando coma en un restaurante, pida que preparen su comida con menos sal o, en lo posible, sin nada de sal. Qu alimentos se recomiendan? Los alimentos enumerados a continuacin no constituyen Furniture conservator/restorer. Hable con el nutricionista sobre las mejores opciones alimenticias para usted. Cereales Pan de salvado o integral. Pasta de salvado o integral. Arroz integral. Avena. Quinua. Trigo burgol. Cereales integrales y con bajo contenido de sodio. Pan pita. Galletitas de [place] con bajo contenido de [place] y [place]. Tortillas de [place] integral. Verduras Verduras frescas o congeladas (crudas, al vapor, asadas o grilladas). Jugos de tomate y verduras con bajo contenido de sodio o reducidos en sodio. Salsa y pasta de tomate con bajo contenido de sodio o reducidas en sodio. Verduras enlatadas con bajo contenido de sodio o reducidas en sodio. Frutas Todas las frutas frescas, congeladas o disecadas. Frutas enlatadas en jugo natural (sin agregado de azcar). Carne y otros alimentos proteicos Pollo o pavo sin piel. Carne de pollo o de [place]. Cerdo desgrasado. Pescado y [employer]. Claras de huevo. Porotos, guisantes o lentejas secos. Frutos secos, mantequilla de frutos secos y semillas sin sal. Frijoles enlatados sin sal. Cortes de carne vacuna magra, desgrasada. Embutidos magros, con bajo contenido de [place]. Lcteos Leche descremada (1%) o descremada. Quesos sin grasa, con bajo contenido de grasa o descremados. Queso blanco o ricota sin grasa, con bajo contenido de [place]. Yogur semidescremado o descremado. Queso con bajo contenido de [place] y [place]. Grasas y [employer] untables que no contengan grasas trans. Aceite vegetal. Lubertha Basque y aderezos para ensaladas livianos o con bajo contenido de grasas (reducidos en sodio). Aceite de canola, crtamo, oliva, soja y [place]. [place]. Condimentos y otros alimentos Hierbas. Especias. Mezclas de condimentos sin  sal. Palomitas de maz y pretzels sin sal. Dulces con bajo contenido de grasas. Qu alimentos no se recomiendan? Los alimentos enumerados a continuacin no constituyen Furniture conservator/restorer. Hable con el nutricionista sobre las mejores opciones alimenticias para usted. Cereales Productos de panificacin hechos con grasa, como medialunas, magdalenas y algunos panes. Comidas con arroz o pasta seca listas para usar. Verduras Verduras con crema o fritas. Verduras en [place]. Verduras enlatadas regulares (que no sean con bajo contenido de sodio o reducidas en sodio). Pasta y salsa de tomates enlatadas regulares (que no sean con bajo contenido de sodio o reducidas en sodio). Jugos de tomate y verduras regulares (que no sean con bajo contenido de sodio o reducidos en sodio). Pepinillos. Aceitunas. Lambert Mody Fruta enlatada en almbar liviano o espeso. Frutas cocidas en aceite. Frutas con salsa de crema o [place]. Carne y otros alimentos proteicos Cortes de carne con grasa. Costillas. Carne frita. [place]. Salchichas. Mortadela y otras carnes procesadas. Salame. Panceta. Perros calientes (hotdogs). [place]. Frutos secos y semillas con sal. Frijoles enlatados con agregado de sal. Pescado enlatado o ahumado. Huevos enteros o yemas. Pollo o pavo con piel. Lcteos Leche entera o al 2%, crema y mitad leche y mitad crema. Queso crema entero o con toda su grasa. Yogur entero o endulzado. Quesos con toda su grasa. Sustitutos de cremas no lcteas. Coberturas batidas. Quesos para untar y [employer]  procesados. Grasas y [employer]. Margarina en barra. [place]. Materia grasa. Mantequilla clarificada. Grasa de panceta. Aceites tropicales como aceite de coco, palmiste o palma. Condimentos y otros alimentos Palomitas de maz y pretzels con sal. Sal de cebolla, sal de ajo, sal condimentada, sal de mesa y sal marina. Salsa [place]shire. Salsa trtara. Salsa barbacoa. Salsa teriyaki. Salsa de soja,  incluso la que tiene contenido reducido de [place]. Salsa de carne. Salsas en lata y envasadas. Salsa de pescado. Salsa de [place]. Salsa rosada. Rbano picante envasado. [place]. [place]aza. Saborizantes y tiernizantes para carne. Caldo en cubitos. Salsa picante y salsa tabasco. Escabeches envasados o ya preparados. Aderezos para tacos prefabricados o envasados. Salsas. Aderezos comunes para ensalada. Dnde encontrar ms informacin:  [place], los Pulmones y Herbalist (National Heart, Lung, and [place]): https://wilson-eaton.com/  Asociacin Estadounidense del Corazn (American Heart Association): www.heart.org Resumen  El plan de alimentacin DASH ha demostrado bajar la presin arterial elevada (hipertensin). Tambin puede reducir UnitedHealth de diabetes tipo 2, enfermedad cardaca y accidente cerebrovascular.  Con el plan de alimentacin DASH, deber limitar el consumo de sal (sodio) a 2,300 mg por da. Si tiene hipertensin, es posible que necesite reducir la ingesta de sodio a 1,500 mg por da.  Cuando siga el plan de alimentacin DASH, trate de comer ms frutas frescas y verduras, cereales integrales, carnes magras, lcteos descremados y grasas cardiosaludables.  Trabaje con su mdico o especialista en alimentacin y nutricin (nutricionista) para ajustar su plan alimentario a sus necesidades calricas personales. Esta informacin no tiene Marine scientist el consejo del mdico. Asegrese de hacerle al mdico cualquier pregunta que tenga. Document Released: 02/06/2011 Document Revised: 06/09/2016 Document Reviewed: 06/09/2016 Elsevier Interactive Patient Education  [employer].

## 2017-09-16 ENCOUNTER — Ambulatory Visit: Payer: Self-pay

## 2017-09-17 ENCOUNTER — Ambulatory Visit (HOSPITAL_COMMUNITY)
Admission: EM | Admit: 2017-09-17 | Discharge: 2017-09-17 | Disposition: A | Discharge status: Home / Self Care | Payer: Self-pay | Attending: Family Medicine | Admitting: Family Medicine

## 2017-09-17 ENCOUNTER — Encounter (HOSPITAL_COMMUNITY): Payer: Self-pay | Admitting: Emergency Medicine

## 2017-09-17 ENCOUNTER — Encounter: Payer: Self-pay | Admitting: Pharmacist

## 2017-09-17 DIAGNOSIS — Z118 Encounter for screening for other infectious and parasitic diseases: Secondary | ICD-10-CM

## 2017-09-17 NOTE — ED Provider Notes (Signed)
Alexandra Jennings    CSN: [id number] Arrival date & time: 09/17/17  1303     History   Chief Complaint Chief Complaint  Patient presents with  . check for lice    HPI Alexandra Jennings is a 57 y.o. female.   Patient is a healthy 57 year old female here for screening of ice.  She denies any obvious insects or pruritus.  She denies any other associated symptoms.     History reviewed. No pertinent past medical history.  There are no active problems to display for this patient.   History reviewed. No pertinent surgical history.  OB History   None      Home Medications    Prior to Admission medications   Not on File    Family History History reviewed. No pertinent family history.  Social History Social History   Tobacco Use  . Smoking status: Never Smoker  . Smokeless tobacco: Never Used  Substance Use Topics  . Alcohol use: Never    Frequency: Never  . Drug use: Never     Allergies   Patient has no known allergies.   Review of Systems Review of Systems  Constitutional: Negative for appetite change, fatigue and fever.  Respiratory: Negative for cough.   Musculoskeletal: Negative for arthralgias and myalgias.  Skin: Negative for rash.  Allergic/Immunologic: Negative for immunocompromised state.  Neurological: Negative for dizziness and headaches.  Hematological: Negative for adenopathy. Does not bruise/bleed easily.     Physical Exam Triage Vital Signs ED Triage Vitals [09/17/17 1413]  Enc Vitals Group     BP (!) 151/98     Pulse Rate 80     Resp 18     Temp 98 F (36.7 C)     Temp Source Oral     SpO2 100 %     Weight      Height      Head Circumference      Peak Flow      Pain Score 0     Pain Loc      Pain Edu?      Excl. in [place]?    No data found.  Updated Vital Signs BP (!) 151/98 (BP Location: Left Arm)   Pulse 80   Temp 98 F (36.7 C) (Oral)   Resp 18   SpO2 100%   Visual Acuity Right Eye Distance:     Left Eye Distance:   Bilateral Distance:    Right Eye Near:   Left Eye Near:    Bilateral Near:     Physical Exam  Constitutional: She is oriented to person, place, and time. She appears well-developed and well-nourished.  HENT:  Head: Normocephalic and atraumatic.  Pulmonary/Chest: Effort normal.  Neurological: She is alert and oriented to person, place, and time.  Skin: Skin is warm and dry. Capillary refill takes less than 2 seconds.  No lice present upon examination.  Psychiatric: She has a normal mood and affect.  Nursing note and vitals reviewed.    UC Treatments / Results  Labs (all labs ordered are listed, but only abnormal results are displayed) Labs Reviewed - No data to display  EKG None  Radiology No results found.  Procedures Procedures (including critical care time)  Medications Ordered in UC Medications - No data to display  Initial Impression / Assessment and Plan / UC Course  I have reviewed the triage vital signs and the nursing notes.  Pertinent labs & imaging results that were available during  my care of the patient were reviewed by me and considered in my medical decision making (see chart for details).     No lice present upon examination.  Note given for court.  Follow-up as needed. Final Clinical Impressions(s) / UC Diagnoses   Final diagnoses:  Screening for head lice     Discharge Instructions     It was nice meeting you!!  There was no signs of lice on exam today.     ED Prescriptions    None     Controlled Substance Prescriptions [place] Controlled Substance Registry consulted? Not Applicable   Orvan July, NP 09/17/17 1445

## 2017-09-17 NOTE — Discharge Instructions (Addendum)
It was nice meeting you!!  There was no signs of lice on exam today.

## 2017-09-17 NOTE — ED Triage Notes (Signed)
Pt here to be checked for lice

## 2017-09-18 ENCOUNTER — Ambulatory Visit: Payer: Self-pay | Attending: Family Medicine

## 2017-09-18 ENCOUNTER — Other Ambulatory Visit: Payer: Self-pay | Admitting: Obstetrics and Gynecology

## 2017-09-18 DIAGNOSIS — Z1231 Encounter for screening mammogram for malignant neoplasm of breast: Secondary | ICD-10-CM

## 2017-09-28 ENCOUNTER — Ambulatory Visit: Payer: Self-pay | Attending: Nurse Practitioner | Admitting: Nurse Practitioner

## 2017-09-28 ENCOUNTER — Encounter: Payer: Self-pay | Admitting: Nurse Practitioner

## 2017-09-28 VITALS — BP 151/80 | HR 85 | Temp 98.3°F | Ht 62.0 in | Wt 172.8 lb

## 2017-09-28 DIAGNOSIS — Z01419 Encounter for gynecological examination (general) (routine) without abnormal findings: Secondary | ICD-10-CM | POA: Insufficient documentation

## 2017-09-28 DIAGNOSIS — I10 Essential (primary) hypertension: Secondary | ICD-10-CM | POA: Insufficient documentation

## 2017-09-28 DIAGNOSIS — Z9851 Tubal ligation status: Secondary | ICD-10-CM | POA: Insufficient documentation

## 2017-09-28 MED ORDER — AMLODIPINE BESYLATE 5 MG PO TABS: 5.0000 mg | ORAL_TABLET | Freq: Every day | ORAL | 3 refills | Status: DC

## 2017-09-28 MED FILL — ?AMLODIPINE BESYLATE 5 MG T: 5 MG | 30 days supply | Qty: 30 | Fill #0

## 2017-09-28 NOTE — Progress Notes (Signed)
Assessment & Plan:  Alexandra Jennings was seen today for gynecologic exam.  Diagnoses and all orders for this visit:  Well woman exam with routine gynecological exam -     Cytology - PAP  Essential hypertension -     amLODipine (NORVASC) 5 MG tablet; Take 1 tablet (5 mg total) by mouth daily. Continue all antihypertensives as prescribed.  Remember to bring in your blood pressure log with you for your follow up appointment.  DASH/Mediterranean Diets are healthier choices for HTN.    Patient has been counseled on age-appropriate routine health concerns for screening and prevention. These are reviewed and up-to-date. Referrals have been placed accordingly. Immunizations are up-to-date or declined.    Subjective:   Chief Complaint  Patient presents with  . Gynecologic Exam    Patient is here for a physical and pap smear.    HPI Alexandra Jennings 57 y.o. female presents to office today for annual wellness exam with PAP. Blood pressure is poorly controlled today. She stopped taking 25mg  of HCTZ a few weeks ago and has only taken 1/2 tablet daily due to dizziness. I will stop HCTZ and start her on amlodipine today. She denies chest pain, shortness of breath, palpitations, lightheadedness, dizziness, headaches or BLE edema.    Review of Systems  Constitutional: Negative.  Negative for chills, fever, malaise/fatigue and weight loss.  HENT: Negative.  Negative for congestion, hearing loss, sinus pain and sore throat.   Eyes: Negative.  Negative for blurred vision, double vision, photophobia and pain.  Respiratory: Negative.  Negative for cough, sputum production, shortness of breath and wheezing.   Cardiovascular: Negative.  Negative for chest pain and leg swelling.  Gastrointestinal: Negative.  Negative for abdominal pain, constipation, diarrhea, heartburn, nausea and vomiting.  Genitourinary: Negative.   Musculoskeletal: Negative.  Negative for joint pain and myalgias.  Skin: Negative.   Negative for rash.  Neurological: Negative.  Negative for dizziness, tremors, speech change, focal weakness, seizures and headaches.  Endo/Heme/Allergies: Negative.  Negative for environmental allergies.  Psychiatric/Behavioral: Negative.  Negative for depression and suicidal ideas. The patient is not nervous/anxious and does not have insomnia.     History reviewed. No pertinent past medical history.  Past Surgical History:  Procedure Laterality Date  . COLONOSCOPY N/A 01/30/2015   Procedure: COLONOSCOPY;  Surgeon: Danie Binder, MD;  Location: [facility];  Service: Endoscopy;  Laterality: N/A;  1300   . TUBAL LIGATION      Family History  Problem Relation Age of Onset  . Cancer Mother   . Cancer Father     Social History Reviewed with no changes to be made today.   Outpatient Medications Prior to Visit  Medication Sig Dispense Refill  . hydrochlorothiazide (HYDRODIURIL) 25 MG tablet Take 1 tablet (25 mg total) by mouth daily. 90 tablet 3   No facility-administered medications prior to visit.     No Known Allergies     Objective:    BP (!) 151/80 (BP Location: Right Arm, Patient Position: Sitting, Cuff Size: Normal)   Pulse 85   Temp 98.3 F (36.8 C) (Oral)   Ht 5\' 2"  (1.575 m)   Wt 172 lb 12.8 oz (78.4 kg)   LMP 04/05/2011   SpO2 100%   BMI 31.61 kg/m  Wt Readings from Last 3 Encounters:  09/28/17 172 lb 12.8 oz (78.4 kg)  08/28/17 179 lb 9.6 oz (81.5 kg)  05/24/15 162 lb 3.2 oz (73.6 kg)    Physical Exam  Constitutional:  She is oriented to person, place, and time. She appears well-developed and well-nourished. No distress.  HENT:  Head: Normocephalic and atraumatic.  Right Ear: External ear normal.  Left Ear: External ear normal.  Nose: Nose normal.  Mouth/Throat: Oropharynx is clear and moist. No oropharyngeal exudate.  Eyes: Pupils are equal, round, and reactive to light. Conjunctivae and EOM are normal. Right eye exhibits no discharge. Left eye  exhibits no discharge. No scleral icterus.  Neck: Normal range of motion. Neck supple. No tracheal deviation present. No thyromegaly present.  Cardiovascular: Normal rate, regular rhythm, normal heart sounds and intact distal pulses. Exam reveals no friction rub.  No murmur heard. Pulmonary/Chest: Effort normal and breath sounds normal. No respiratory distress. She has no decreased breath sounds. She has no wheezes. She has no rhonchi. She has no rales. She exhibits no tenderness. Right breast exhibits no inverted nipple, no mass, no nipple discharge, no skin change and no tenderness. Left breast exhibits no inverted nipple, no mass, no nipple discharge, no skin change and no tenderness.  Abdominal: Soft. Bowel sounds are normal. She exhibits no distension and no mass. There is no tenderness. There is no rebound and no guarding. Hernia confirmed negative in the right inguinal area and confirmed negative in the left inguinal area.  Genitourinary: Vagina normal. Rectal exam shows no external hemorrhoid, no fissure, no mass and anal tone normal. No labial fusion. There is no rash, tenderness, lesion or injury on the right labia. There is no rash, tenderness, lesion or injury on the left labia. Uterus is not tender. Cervix exhibits no motion tenderness, no discharge and no friability. Right adnexum displays no mass, no tenderness and no fullness. Left adnexum displays no mass, no tenderness and no fullness. No erythema, tenderness or bleeding in the vagina. No vaginal discharge found.  Musculoskeletal: Normal range of motion. She exhibits no edema, tenderness or deformity.  Lymphadenopathy:    She has no cervical adenopathy.  Neurological: She is alert and oriented to person, place, and time. No cranial nerve deficit. Coordination normal.  Skin: Skin is warm and dry. No rash noted. She is not diaphoretic. No erythema. No pallor.  Psychiatric: She has a normal mood and affect. Her behavior is normal. Judgment  and thought content normal.       Patient has been counseled extensively about nutrition and exercise as well as the importance of adherence with medications and regular follow-up. The patient was given clear instructions to go to ER or return to medical center if symptoms don't improve, worsen or new problems develop. The patient verbalized understanding.   Follow-up: Return in about 3 weeks (around 10/19/2017) for BP recheck.   [name], FNP-BC [facility] [place], [place]   09/28/2017, 12:08 PM

## 2017-09-30 ENCOUNTER — Other Ambulatory Visit: Payer: Self-pay | Admitting: Nurse Practitioner

## 2017-09-30 LAB — CYTOLOGY - PAP
BACTERIAL VAGINITIS: POSITIVE — AB
CANDIDA VAGINITIS: NEGATIVE
Chlamydia: NEGATIVE
Diagnosis: NEGATIVE
Neisseria Gonorrhea: NEGATIVE
Trichomonas: NEGATIVE

## 2017-09-30 MED ORDER — METRONIDAZOLE 500 MG PO TABS: 500.0000 mg | ORAL_TABLET | Freq: Two times a day (BID) | ORAL | 0 refills | Status: AC

## 2017-10-01 ENCOUNTER — Telehealth: Payer: Self-pay

## 2017-10-01 MED FILL — metroNIDAZOLE 500 MG TABS: 500 | 7 days supply | Qty: 14 | Fill #0

## 2017-10-01 NOTE — Telephone Encounter (Signed)
-----   Message from [name], NP sent at 09/30/2017  9:26 PM EDT ----- PAP smear is negative for cervical cancer.  However it does show positive bacterial vaginosis. Will send in script for medication to treat bacteria

## 2017-10-01 NOTE — Telephone Encounter (Signed)
CMA attempt to call patient to inform on results.  No answer and unable to leave VM, due to no VM has been set up.  A letter will be send out to reach patient.  If patient call back, please inform:  PAP smear is negative for cervical cancer.  However it does show positive bacterial vaginosis. Will send in script for medication to treat bacteria

## 2017-10-12 ENCOUNTER — Encounter: Payer: Self-pay | Admitting: Pharmacist

## 2017-10-12 ENCOUNTER — Ambulatory Visit: Payer: Self-pay | Attending: Family Medicine | Admitting: Pharmacist

## 2017-10-12 VITALS — BP 143/80 | HR 96

## 2017-10-12 DIAGNOSIS — I10 Essential (primary) hypertension: Secondary | ICD-10-CM

## 2017-10-12 NOTE — Progress Notes (Signed)
   S:    PCP: Geryl Rankins  Patient arrives in good sprits. Presents to the clinic for hypertension evaluation, counseling, and management. Patient was referred by [facility] and was last seen by her on 09/28/17.  HPI:  Visit w/ PCP 09/28/17 - BP elevated at 151/80. HCTZ was stopped d/t continued dizziness and amlodipine initiated at 5 mg daily.   Today, pt reports no problems with amlodipine. Denies dizziness, CP, HA, or SOB.    Patient reports adherence with medications.  Current BP Medications include:   - amlodipine 5 mg daily.   Antihypertensives tried in the past include:  - hctz 25 mg daily  Dietary habits include:  - pt states she still eats foods high in sodium. She is working on this. Exercise habits include: - reports engaging in 40 minutes via walking every night Family / Social history:  - Mother and Father deceased - unspecified cancer - Tobacco: never smoker - Alcohol: denies use  Home BP readings:  - Pt reports that her daughter takes her BP at home  - Reported SBP readings: 110s-120s. Denies getting readings > 130. - Reported DBP readings: 70 - 83.   O:  BP taken in L arm after 5 minutes rest: 143/80, HR 96  Last 3 Office BP readings: BP Readings from Last 3 Encounters:  09/28/17 (!) 151/80  09/17/17 (!) 151/98  09/11/17 129/86    BMET    Component Value Date/Time   NA 142 08/28/2017 1216   K 4.3 08/28/2017 1216   CL 106 08/28/2017 1216   CO2 22 08/28/2017 1216   GLUCOSE 85 08/28/2017 1216   GLUCOSE 117 (H) 01/17/2015 1450   BUN 12 08/28/2017 1216   CREATININE 0.60 08/28/2017 1216   CREATININE 0.54 03/16/2014 1656   CALCIUM 9.4 08/28/2017 1216   GFRNONAA 103 08/28/2017 1216   GFRNONAA >89 03/16/2014 1656   GFRAA 119 08/28/2017 1216   GFRAA >89 03/16/2014 1656    Renal function: CrCl cannot be calculated (Patient's most recent lab result is older than the maximum 21 days allowed.).  A/P: Hypertension longstanding currently uncontrolled on  amlodipine based on today's clinic reading. BP Goal <130/80 mmHg. Patient is adherent with amlodipine. Of note, she reports goal BP at home and states that she becomes anxious during clinic encounters. Advised her to keep a log of home blood pressures and bring these in to her next PCP visit. Will not change medication at this time.   -Continued amlodipine 5 mg daily.  -Counseled on lifestyle modifications for blood pressure control including reduced dietary sodium, increased exercise, adequate sleep  Results reviewed and written information provided.   Total time in face-to-face counseling 15 minutes.   F/U Clinic Visit with PCP.  Benard Halsted, PharmD, [place] [phone]

## 2017-10-12 NOTE — Patient Instructions (Signed)
Thank you for coming to see me today. Please continue to take amlodipine 5 mg a day.  Continue to monitor blood pressure at home and record. Bring these into your next doctor's appointment.

## 2017-10-20 ENCOUNTER — Ambulatory Visit (HOSPITAL_COMMUNITY)
Admission: RE | Admit: 2017-10-20 | Discharge: 2017-10-20 | Disposition: A | Discharge status: Home / Self Care | Payer: Self-pay | Source: Ambulatory Visit | Attending: Obstetrics and Gynecology | Admitting: Obstetrics and Gynecology

## 2017-10-20 ENCOUNTER — Ambulatory Visit
Admission: RE | Admit: 2017-10-20 | Discharge: 2017-10-20 | Disposition: A | Discharge status: Home / Self Care | Payer: No Typology Code available for payment source | Source: Ambulatory Visit | Attending: Obstetrics and Gynecology | Admitting: Obstetrics and Gynecology

## 2017-10-20 ENCOUNTER — Encounter (HOSPITAL_COMMUNITY): Payer: Self-pay

## 2017-10-20 VITALS — BP 118/78 | Wt 175.8 lb

## 2017-10-20 DIAGNOSIS — Z1231 Encounter for screening mammogram for malignant neoplasm of breast: Secondary | ICD-10-CM

## 2017-10-20 DIAGNOSIS — Z1239 Encounter for other screening for malignant neoplasm of breast: Secondary | ICD-10-CM

## 2017-10-20 HISTORY — DX: Essential (primary) hypertension: I10

## 2017-10-20 NOTE — Progress Notes (Signed)
No complaints today.   Pap Smear: Pap smear not completed today. Last Pap smear was 09/28/2017 at [facility] and normal with negative HPV. Per patient has no history of an abnormal Pap smear. Last Pap smear result is in Epic.  Physical exam: Breasts Breasts symmetrical. No skin abnormalities bilateral breasts. No nipple retraction bilateral breasts. No nipple discharge bilateral breasts. No lymphadenopathy. No lumps palpated bilateral breasts. No complaints of pain or tenderness on exam. Referred patient to the [facility] for a screening mammogram. Appointment scheduled for Tuesday, October 20, 2017 at 1540.        Pelvic/Bimanual No Pap smear completed today since last Pap smear and HPV typing was 09/28/2017. Pap smear not indicated per BCCCP guidelines.   Smoking History: Patient has never smoked.   Patient Navigation: Patient education provided. Access to services provided for patient through [facility] program. Spanish interpreter provided.   Colorectal Cancer Screening: Patient had a colonoscopy completed 01/30/2015. No complaints today. FIT Test given to patient to complete and return to BCCCP.  Breast and Cervical Cancer Risk Assessment: Patient has no family history of breast cancer, known genetic mutations, or radiation treatment to the chest before age 30. Patient has no history of cervical dysplasia, immunocompromised, or DES exposure in-utero. Risk Assessment    Risk Scores      10/20/2017   Last edited by: Armond Hang, LPN   5-year risk: 0.6 %   Lifetime risk: 4.2 %           Used Spanish interpreter Cresenciano Genre from [employer].

## 2017-10-20 NOTE — Patient Instructions (Signed)
Explained breast self awareness with Alexandra Jennings. Patient did not need a Pap smear today due to last Pap smear and HPV typing was 09/28/2017. Let her know BCCCP will cover Pap smears and HPV typing every 5 years unless has a history of abnormal Pap smears. Referred patient to the [facility] for a screening mammogram. Appointment scheduled for Tuesday, October 20, 2017 at 1540. Let [facility] know [facility] will [facility] up with her within the next couple weeks with results of mammogram by letter or phone. Alexandra Jennings verbalized understanding.  Brannock, Arvil Chaco, RN 3:13 PM

## 2017-10-21 ENCOUNTER — Encounter (HOSPITAL_COMMUNITY): Payer: Self-pay | Admitting: *Deleted

## 2017-10-26 ENCOUNTER — Other Ambulatory Visit: Payer: Self-pay

## 2017-10-27 MED FILL — ?AMLODIPINE BESYLATE 5 MG T: 5 MG | 30 days supply | Qty: 30 | Fill #1

## 2017-11-01 LAB — FECAL OCCULT BLOOD, IMMUNOCHEMICAL: Fecal Occult Bld: NEGATIVE

## 2017-11-03 ENCOUNTER — Encounter (HOSPITAL_COMMUNITY): Payer: Self-pay | Admitting: *Deleted

## 2017-11-03 NOTE — Progress Notes (Signed)
Letter mailed to patient with negative Fit Test results.  

## 2017-11-09 ENCOUNTER — Emergency Department (HOSPITAL_COMMUNITY): Payer: No Typology Code available for payment source

## 2017-11-09 ENCOUNTER — Emergency Department (HOSPITAL_COMMUNITY)
Admission: EM | Admit: 2017-11-09 | Discharge: 2017-11-09 | Disposition: A | Discharge status: Home / Self Care | Payer: No Typology Code available for payment source | Attending: Emergency Medicine | Admitting: Emergency Medicine

## 2017-11-09 ENCOUNTER — Other Ambulatory Visit: Payer: Self-pay

## 2017-11-09 DIAGNOSIS — I10 Essential (primary) hypertension: Secondary | ICD-10-CM | POA: Insufficient documentation

## 2017-11-09 DIAGNOSIS — Z79899 Other long term (current) drug therapy: Secondary | ICD-10-CM | POA: Insufficient documentation

## 2017-11-09 DIAGNOSIS — R0789 Other chest pain: Secondary | ICD-10-CM

## 2017-11-09 LAB — BASIC METABOLIC PANEL
Anion gap: 12 (ref 5–15)
BUN: 12 mg/dL (ref 6–20)
CALCIUM: 9.3 mg/dL (ref 8.9–10.3)
CHLORIDE: 104 mmol/L (ref 98–111)
CO2: 23 mmol/L (ref 22–32)
Creatinine, Ser: 0.59 mg/dL (ref 0.44–1.00)
GFR calc non Af Amer: >60 mL/min (ref >60)
GLUCOSE: 107 mg/dL — AB (ref 70–99)
Potassium: 3.4 mmol/L — ABNORMAL LOW (ref 3.5–5.1)
Sodium: 139 mmol/L (ref 135–145)

## 2017-11-09 LAB — CBC
HCT: 43.9 % (ref 36.0–46.0)
Hemoglobin: 14.7 g/dL (ref 12.0–15.0)
MCH: 31.7 pg (ref 26.0–34.0)
MCHC: 33.5 g/dL (ref 30.0–36.0)
MCV: 94.8 fL (ref 78.0–100.0)
Platelets: 230 10*3/uL (ref 150–400)
RBC: 4.63 MIL/uL (ref 3.87–5.11)
RDW: 12.2 % (ref 11.5–15.5)
WBC: 7.6 10*3/uL (ref 4.0–10.5)

## 2017-11-09 LAB — I-STAT TROPONIN, ED
TROPONIN I, POC: 0.02 ng/mL (ref 0.00–0.08)
Troponin i, poc: 0.02 ng/mL (ref 0.00–0.08)

## 2017-11-09 MED ORDER — SODIUM CHLORIDE 0.9 % IV BOLUS
1000.0000 mL | Freq: Once | INTRAVENOUS | Status: AC
  Administered 2017-11-09: 1000 mL via INTRAVENOUS

## 2017-11-09 MED ORDER — METHOCARBAMOL 1000 MG/10ML IJ SOLN: 1000.0000 mg | Freq: Once | INTRAMUSCULAR | Status: DC

## 2017-11-09 MED ORDER — METHOCARBAMOL 1000 MG/10ML IJ SOLN
1000.0000 mg | Freq: Once | INTRAVENOUS | Status: AC
  Administered 2017-11-09: 1000 mg via INTRAVENOUS
  Filled 2017-11-09: qty 10

## 2017-11-09 MED ORDER — METHOCARBAMOL 500 MG PO TABS: 500.0000 mg | ORAL_TABLET | Freq: Two times a day (BID) | ORAL | 0 refills | Status: DC

## 2017-11-09 NOTE — ED Notes (Signed)
The  Pt has had multiple complaints for 4 months headache back pain  Chest pain

## 2017-11-09 NOTE — ED Provider Notes (Signed)
[facility] EMERGENCY DEPARTMENT Provider Note   CSN: [id number] Arrival date & time: 11/09/17  0012     History   Chief Complaint Chief Complaint  Patient presents with  . Chest Pain    pressure    HPI Alexandra Jennings is a 57 y.o. female with a past medical history of hypertension, who presents to ED for evaluation of 11-month history of worsening chest pain, anxiety, generalized body aches.  Patient states that her symptoms got worse earlier today when she got mad at a family member.  States that she has been treated for her high blood pressure with antihypertensives and states that this has helped with her symptoms.  However, she is unsure if her anxiety is getting worse.  She denies any shortness of breath.  She describes the pain as "just pressure."  Her pressure is improved with the breathing.  Denies any cough, hemoptysis, recent surgeries, recent prolonged travel, leg swelling, history of MI or PE, history of cancer.  HPI  Past Medical History:  Diagnosis Date  . Hypertension     Patient Active Problem List   Diagnosis Date Noted  . Chronic sinusitis 05/24/2015  . Reactive airway disease 05/24/2015  . Screening for breast cancer 05/24/2015  . Post-nasal drip 04/23/2015  . Sinusitis, chronic 04/23/2015  . Special screening for malignant neoplasms, colon   . Gastroesophageal reflux disease without esophagitis 12/21/2014  . Routine physical examination 12/21/2014  . Preventative health care 05/26/2013  . Pap smear for cervical cancer screening 05/26/2013    Past Surgical History:  Procedure Laterality Date  . COLONOSCOPY N/A 01/30/2015   Procedure: COLONOSCOPY;  Surgeon: Danie Binder, MD;  [facility];  Service: Endoscopy;  Laterality: N/A;  1300   . TUBAL LIGATION       OB History    Gravida  3   Para      Term      Preterm      AB      Living  3     SAB      TAB      Ectopic      Multiple      Live Births   3            Home Medications    Prior to Admission medications   Medication Sig Start Date End Date Taking? Authorizing Provider  amLODipine (NORVASC) 5 MG tablet Take 1 tablet (5 mg total) by mouth daily. 09/28/17   [name], NP  methocarbamol (ROBAXIN) 500 MG tablet Take 1 tablet (500 mg total) by mouth 2 (two) times daily. 11/09/17   Delia Heady, PA-C    Family History Family History  Problem Relation Age of Onset  . Cancer Mother        uterine  . Cancer Father        prostate    Social History Social History   Tobacco Use  . Smoking status: Never Smoker  . Smokeless tobacco: Never Used  Substance Use Topics  . Alcohol use: Never    Frequency: Never  . Drug use: Never     Allergies   Patient has no known allergies.   Review of Systems Review of Systems  Constitutional: Negative for appetite change, chills and fever.  HENT: Negative for ear pain, rhinorrhea, sneezing and sore throat.   Eyes: Negative for photophobia and visual disturbance.  Respiratory: Negative for cough, chest tightness, shortness of breath and wheezing.  Cardiovascular: Positive for chest pain (pressure). Negative for palpitations.  Gastrointestinal: Negative for abdominal pain, blood in stool, constipation, diarrhea, nausea and vomiting.  Genitourinary: Negative for dysuria, hematuria and urgency.  Musculoskeletal: Positive for myalgias.  Skin: Negative for rash.  Neurological: Negative for dizziness, weakness and light-headedness.  Psychiatric/Behavioral: The patient is nervous/anxious.      Physical Exam Updated Vital Signs BP (!) 156/96 (BP Location: Right Arm)   Pulse 92   Temp 98.4 F (36.9 C) (Oral)   Resp 16   LMP 04/05/2011   SpO2 100%   Physical Exam  Constitutional: She appears well-developed and well-nourished. No distress.  Nontoxic-appearing and in no acute distress.  Speaking complete sentences without difficulty.  HENT:  Head: Normocephalic and  atraumatic.  Nose: Nose normal.  Eyes: Conjunctivae and EOM are normal. Left eye exhibits no discharge. No scleral icterus.  Neck: Normal range of motion. Neck supple.  Cardiovascular: Normal rate, regular rhythm, normal heart sounds and intact distal pulses. Exam reveals no gallop and no friction rub.  No murmur heard. Pulmonary/Chest: Effort normal and breath sounds normal. No respiratory distress.  Abdominal: Soft. Bowel sounds are normal. She exhibits no distension. There is no tenderness. There is no guarding.  Musculoskeletal: Normal range of motion. She exhibits no edema.  No lower extremity edema, erythema or calf tenderness bilaterally.  Neurological: She is alert. She exhibits normal muscle tone. Coordination normal.  Skin: Skin is warm and dry. No rash noted.  Psychiatric: She has a normal mood and affect.  Nursing note and vitals reviewed.    ED Treatments / Results  Labs (all labs ordered are listed, but only abnormal results are displayed) Labs Reviewed  BASIC METABOLIC PANEL - Abnormal; Notable for the following components:      Result Value   Potassium 3.4 (*)    Glucose, Bld 107 (*)    All other components within normal limits  CBC  I-STAT TROPONIN, ED  I-STAT TROPONIN, ED    EKG EKG Interpretation  Date/Time:  Monday November 09 2017 00:28:09 EDT Ventricular Rate:  96 PR Interval:  142 QRS Duration: 74 QT Interval:  352 QTC Calculation: 444 R Axis:   15 Text Interpretation:  Normal sinus rhythm Nonspecific ST abnormality Abnormal ECG No significant change since last tracing Confirmed by Pryor Curia [phone]) on 11/09/2017 2:52:52 AM   Radiology Dg Chest 2 View  Result Date: 11/09/2017 CLINICAL DATA:  Shortness of breath.  Chest pressure. EXAM: CHEST - 2 VIEW COMPARISON:  10/11/2013 FINDINGS: Low lung volumes with bibasilar atelectasis on the frontal view.The cardiomediastinal contours are normal. Pulmonary vasculature is normal. No consolidation, pleural  effusion, or pneumothorax. No acute osseous abnormalities are seen. IMPRESSION: Low lung volumes with bibasilar atelectasis. Electronically Signed   By: Keith Rake M.D.   On: 11/09/2017 01:00    Procedures Procedures (including critical care time)  Medications Ordered in ED Medications  sodium chloride 0.9 % bolus 1,000 mL (1,000 mLs Intravenous New Bag/Given 11/09/17 0349)  methocarbamol (ROBAXIN) 1,000 mg in dextrose 5 % 50 mL IVPB (1,000 mg Intravenous New Bag/Given 11/09/17 0354)     Initial Impression / Assessment and Plan / ED Course  I have reviewed the triage vital signs and the nursing notes.  Pertinent labs & imaging results that were available during my care of the patient were reviewed by me and considered in my medical decision making (see chart for details).     57 year old female with a past medical history of  hypertension presents to ED for evaluation of 85-month history of worsening chest pain, anxiety and generalized body aches.  However, she denies pain and states that it is more of a pressure sensation.  Symptoms got worse earlier today when she got mad and yelled out a family member.  She denies any hemoptysis, shortness of breath.  Denies pleuritic chest pain.  On exam she is overall well-appearing.  Tenderness to palpation of the chest noted mildly.  No lower extremity edema to suggest DVT.  She is speaking complete sentences without difficulty with no signs of respiratory distress.  She is afebrile.  Mildly hypertensive here but she states that the antihypertensive she has been prescribed for the past 2 months have helped with the symptoms.  She is not tachycardic, tachypneic or hypoxic.  Lab work including troponin x2, CBC, BMP unremarkable.  EKG shows normal sinus rhythm with no significant changes from prior tracings.  Chest x-ray is negative for acute abnormality.  She is low risk by heart score and low risk by Wells score for PE.  I doubt cardiac cause of her  symptoms.  Patient given muscle relaxer and fluids here with significant improvement in her symptoms.  There could be a component of anxiety.  Will advise patient to follow-up with her primary care provider for any antianxiety medications.  Advised to return to ED for any severe worsening symptoms.  Portions of this note were generated with [employer]. Dictation errors may occur despite best attempts at proofreading.   Final Clinical Impressions(s) / ED Diagnoses   Final diagnoses:  Chest wall pain    ED Discharge Orders         Ordered    methocarbamol (ROBAXIN) 500 MG tablet  2 times daily     11/09/17 0426           Delia Heady, PA-C 11/09/17 0428    Ward, Delice Bison, DO 11/09/17 [phone]

## 2017-11-09 NOTE — Discharge Instructions (Signed)
Return to ED for worsening symptoms, trouble breathing or trouble swallowing, vomiting or coughing up blood, lightheadedness or loss of consciousness.

## 2017-11-09 NOTE — ED Triage Notes (Signed)
Patient states that she is here for her blood pressure because she has been having these "symptoms". When asked what her symptoms are she stated "pressure in my chest, feels deep when I take a deep breath...". Patient also admits to feeling "anxious" and having a "dry mouth".

## 2017-11-10 MED FILL — METHOCARBAMOL 500 MG TABS: 500 | 10 days supply | Qty: 20 | Fill #0

## 2017-12-22 MED FILL — ?AMLODIPINE BESYLATE 5 MG T: 5 MG | 30 days supply | Qty: 30 | Fill #3

## 2017-12-23 ENCOUNTER — Encounter: Payer: Self-pay | Admitting: Gastroenterology

## 2018-01-05 ENCOUNTER — Encounter: Payer: Self-pay | Admitting: Nurse Practitioner

## 2018-01-05 ENCOUNTER — Ambulatory Visit: Payer: Self-pay | Attending: Nurse Practitioner | Admitting: Nurse Practitioner

## 2018-01-05 VITALS — BP 156/84 | HR 101 | Temp 98.5°F | Ht 62.0 in | Wt 173.2 lb

## 2018-01-05 DIAGNOSIS — I10 Essential (primary) hypertension: Secondary | ICD-10-CM

## 2018-01-05 DIAGNOSIS — Z79899 Other long term (current) drug therapy: Secondary | ICD-10-CM | POA: Insufficient documentation

## 2018-01-05 DIAGNOSIS — R7303 Prediabetes: Secondary | ICD-10-CM

## 2018-01-05 LAB — GLUCOSE, POCT (MANUAL RESULT ENTRY): POC GLUCOSE: 108 mg/dL — AB (ref 70–99)

## 2018-01-05 MED ORDER — AMLODIPINE BESYLATE 10 MG PO TABS: 10.0000 mg | ORAL_TABLET | Freq: Every day | ORAL | 2 refills | Status: DC

## 2018-01-05 MED FILL — AMLODIPINE BESYLATE 10 MG T: 10 | 30 days supply | Qty: 30 | Fill #0

## 2018-01-05 NOTE — Progress Notes (Signed)
Assessment & Plan:  Alexandra Jennings was seen today for follow-up.  Diagnoses and all orders for this visit:  Essential hypertension -     amLODipine (NORVASC) 10 MG tablet; Take 1 tablet (10 mg total) by mouth daily. Continue all antihypertensives as prescribed.  Remember to bring in your blood pressure log with you for your follow up appointment.  DASH/Mediterranean Diets are healthier choices for HTN.   Prediabetes -     Glucose (CBG) -     CMP14+EGFR Continue blood sugar control as discussed in office today, low carbohydrate diet, and regular physical exercise as tolerated, 150 minutes per week (30 min each day, 5 days per week, or 50 min 3 days per week). Annual eye exams and foot exams are recommended.     Patient has been counseled on age-appropriate routine health concerns for screening and prevention. These are reviewed and up-to-date. Referrals have been placed accordingly. Immunizations are up-to-date or declined.    Subjective:   Chief Complaint  Patient presents with  . Follow-up    Pt. is here to follow-up on hypertension.    HPI Alexandra Jennings 57 y.o. female presents to office today for follow up to HTN.   CHRONIC HYPERTENSION Disease Monitoring  Blood pressure is poorly controlled today.  She is monitoring her blood pressure at home with averages 138-140/70-80-90s. Will increase amlodipine 64m to 140m She also states she does not take her amlodipine every day as prescribed.  BP Readings from Last 3 Encounters:  01/05/18 (!) 165/105  11/09/17 (!) 145/74  10/20/17 118/78   Chest pain: no   Dyspnea: no   Claudication: no  Medication compliance: no  Medication Side Effects  Lightheadedness: no   Urinary frequency: no   Edema: no   Impotence: no  Preventitive Healthcare:  Exercise: no   Diet Pattern: diet: general  Salt Restriction:  no   Prediabetes Chronic and well controlled. Diet controlled at this time. Denies any hypo or hyperglycemic  symptoms at this time. She does not monitor her glucose levels at home. Overdue for eye exam. Patient has been advised to apply for financial assistance and schedule to see our financial counselor.  Lab Results  Component Value Date   HGBA1C 5.7 (H) 08/28/2017   Review of Systems  Constitutional: Negative for fever, malaise/fatigue and weight loss.  HENT: Negative.  Negative for nosebleeds.   Eyes: Negative.  Negative for blurred vision, double vision and photophobia.  Respiratory: Negative.  Negative for cough and shortness of breath.   Cardiovascular: Negative.  Negative for chest pain, palpitations and leg swelling.  Gastrointestinal: Negative.  Negative for heartburn, nausea and vomiting.  Musculoskeletal: Negative.  Negative for myalgias.  Neurological: Negative.  Negative for dizziness, focal weakness, seizures and headaches.  Psychiatric/Behavioral: Negative.  Negative for suicidal ideas.    Past Medical History:  Diagnosis Date  . Hypertension     Past Surgical History:  Procedure Laterality Date  . COLONOSCOPY N/A 01/30/2015   Procedure: COLONOSCOPY;  Surgeon: SaDanie BinderMD;  Location: [facility];  Service: Endoscopy;  Laterality: N/A;  1300   . TUBAL LIGATION      Family History  Problem Relation Age of Onset  . Cancer Mother        uterine  . Cancer Father        prostate    Social History Reviewed with no changes to be made today.   Outpatient Medications Prior to Visit  Medication Sig Dispense Refill  .  amLODipine (NORVASC) 5 MG tablet Take 1 tablet (5 mg total) by mouth daily. 90 tablet 3  . methocarbamol (ROBAXIN) 500 MG tablet Take 1 tablet (500 mg total) by mouth 2 (two) times daily. (Patient not taking: Reported on 01/05/2018) 20 tablet 0   No facility-administered medications prior to visit.     No Known Allergies     Objective:    BP (!) 165/105 (BP Location: Left Arm, Patient Position: Sitting, Cuff Size: Normal)   Pulse (!) 101   Temp  98.5 F (36.9 C) (Oral)   Ht _0  (1.575 m)   Wt 173 lb 3.2 oz (78.6 kg)   LMP 04/05/2011   SpO2 100%   BMI 31.68 kg/m  Wt Readings from Last 3 Encounters:  01/05/18 173 lb 3.2 oz (78.6 kg)  10/20/17 175 lb 12.8 oz (79.7 kg)  09/28/17 172 lb 12.8 oz (78.4 kg)    Physical Exam  Constitutional: She is oriented to person, place, and time. She appears well-developed and well-nourished. She is cooperative.  HENT:  Head: Normocephalic and atraumatic.  Eyes: EOM are normal.  Neck: Normal range of motion.  Cardiovascular: Regular rhythm, normal heart sounds and intact distal pulses. Tachycardia present. Exam reveals no gallop and no friction rub.  No murmur heard. Pulmonary/Chest: Effort normal and breath sounds normal. No tachypnea. No respiratory distress. She has no decreased breath sounds. She has no wheezes. She has no rhonchi. She has no rales. She exhibits no tenderness.  Abdominal: Soft. Bowel sounds are normal.  Musculoskeletal: Normal range of motion. She exhibits no edema.  Neurological: She is alert and oriented to person, place, and time. Coordination normal.  Skin: Skin is warm and dry.  Psychiatric: She has a normal mood and affect. Her behavior is normal. Judgment and thought content normal.  Nursing note and vitals reviewed.      Patient has been counseled extensively about nutrition and exercise as well as the importance of adherence with medications and regular follow-up. The patient was given clear instructions to go to ER or return to medical center if symptoms don't improve, worsen or new problems develop. The patient verbalized understanding.   Follow-up: Return in about 6 weeks (around 02/16/2018) for prediabetes, BP recheck .   [name], FNP-BC [facility] [place], [place]   01/05/2018, 3:42 PM

## 2018-01-05 NOTE — Patient Instructions (Signed)
Plan de alimentacin DASH DASH Eating Plan DASH es la sigla en ingls de "Enfoques Alimentarios para Detener la Hipertensin" (Dietary Approaches to Stop Hypertension). El plan de alimentacin DASH ha demostrado bajar la presin arterial elevada (hipertensin). Tambin puede reducir [employer] de diabetes tipo 2, enfermedad cardaca y accidente cerebrovascular. Este plan tambin puede ayudar a Horticulturist, commercial. Consejos para seguir este plan Pautas generales  Evite ingerir ms de 2,300 mg (miligramos) de sal (sodio) por da. Si tiene hipertensin, es posible que necesite reducir la ingesta de sodio a 1,500 mg por da.  Limite el consumo de alcohol a no ms de 53mdida por da si es mujer y no est [place] y 259midas por da si es hombre. Una medida equivale a 12oz (35552mde cerveza, 5oz (148m27me vino o 1oz (44ml43m bebidas alcohlicas de alta graduacin.  Trabaje con su mdico para mantener un peso saludable o perder peso.[place]iberty Mediagntele cul es el peso recomendado para usted.  Realice al menos 30 minutos de ejercicio que haga que se acelere su corazn (ejercicio aerbiArboriculturistmayorHartford Financiala semanBayside Gardensas actividades pueden incluir caminar, nadar o andar en bicicleta.  Trabaje con su mdico o especialista en alimentacin y nutricin (nutricionista) para ajustar su plan alimentario a sus necesidades calricas personales. Lectura de las etiquetas de los alimentos  Verifique en las etiquetas de los alimentos, la cantidad de sodio por porcin. Elija alimentos con menos del 5 por ciento del valor diario de sodio. Generalmente, los alimentos con menos de 300 mg de sodio por porcin se encuadran dentro de este plan alimentario.  Para encontrar cereales integrales, busque la palabra "integral" como primera palabra en la lista de ingredientes. De compras  Compre productos en los que en su etiqueta diga: "bajo contenido de sodio" o "sin agregado de sal".  Compre alimentos frescos. Evite  los alimentos enlatados y comidas precocidas o congeladas. Coccin  Evite agregar sal cuando cocine. Use hierbas o aderezos sin sal, en lugar de sal de mesa o sal marina. Consulte al mdico o farmacutico antes de usar sustitutos de la sal.  No fra los alimentos. A la hora de cocinar los alimentos opte por hornearlos, hervirlos, grillarlos y asarlos a la paAdministrator, artscine con aceites cardiosaludables, como oliva, canola, soja o girasol. Planificacin de las comidas   Consuma una dieta equilibrada, que incluya lo siguiente: ? 5o ms porciones de frutas y verduSet designerte de que la mitad del plato de cada comida sean frutas y verduras. ? Hasta 6 u 8 porciones de cereales integrales por da. ? Menos de 6 onzas de carne, aves o pescado magroGames developer porcin de 3 onzas de carne tiene casi el mismo tamao que un mazo de cartas. Un huevo equivale a 1 onza. ? Dos porciones de productos lcteos descremados por da. ?Training and development officerna porcin de frutos secos, semillas o frijoles 5 veces por semana. ? Grasas cardiosaludables. Las grasas saludables llamadas cidos grasos omega-3 se encuentran en alimentos como semillas de lino y pescados de agua fra, como por ejemplo, sardinas, salmn y caballa.  Limite la cantidad que ingiere de los siguientes alimentos: ? Alimentos enlatados o envasados. ? Alimentos con alto contenido de grasa trans, como alimentos fritos. ? Alimentos con alto contenido de grasa saturada, como carne con grasa. ? Dulces, postres, bebidas azucaradas y otros alimentos con azcar agregada. ? Productos lcteos enteros.  No le agregue sal a los alimentos antes de probarlos.  Trate de comer al  menos 2 comidas vegetarianas por semana.  Consuma ms comida casera y menos de restaurante, de bufs y comida rpida.  Cuando coma en un restaurante, pida que preparen su comida con menos sal o, en lo posible, sin nada de sal. Qu alimentos se recomiendan? Los alimentos enumerados a  continuacin no constituyen Furniture conservator/restorer. Hable con el nutricionista sobre las mejores opciones alimenticias para usted. Cereales Pan de salvado o integral. Pasta de salvado o integral. Arroz integral. Avena. Quinua. Trigo burgol. Cereales integrales y con bajo contenido de sodio. Pan pita. Galletitas de [place] con bajo contenido de [place] y [place]. Tortillas de [place] integral. Verduras Verduras frescas o congeladas (crudas, al vapor, asadas o grilladas). Jugos de tomate y verduras con bajo contenido de sodio o reducidos en sodio. Salsa y pasta de tomate con bajo contenido de sodio o reducidas en sodio. Verduras enlatadas con bajo contenido de sodio o reducidas en sodio. Frutas Todas las frutas frescas, congeladas o disecadas. Frutas enlatadas en jugo natural (sin agregado de azcar). Carne y otros alimentos proteicos Pollo o pavo sin piel. Carne de pollo o de [place]. Cerdo desgrasado. Pescado y [employer]. Claras de huevo. Porotos, guisantes o lentejas secos. Frutos secos, mantequilla de frutos secos y semillas sin sal. Frijoles enlatados sin sal. Cortes de carne vacuna magra, desgrasada. Embutidos magros, con bajo contenido de [place]. Lcteos Leche descremada (1%) o descremada. Quesos sin grasa, con bajo contenido de grasa o descremados. Queso blanco o ricota sin grasa, con bajo contenido de [place]. Yogur semidescremado o descremado. Queso con bajo contenido de [place] y [place]. Grasas y [employer] untables que no contengan grasas trans. Aceite vegetal. Lubertha Basque y aderezos para ensaladas livianos o con bajo contenido de grasas (reducidos en sodio). Aceite de canola, crtamo, oliva, soja y [place]. [place]. Condimentos y otros alimentos Hierbas. Especias. Mezclas de condimentos sin sal. Palomitas de maz y pretzels sin sal. Dulces con bajo contenido de grasas. Qu alimentos no se recomiendan? Los alimentos enumerados a continuacin no constituyen Furniture conservator/restorer. Hable con el  nutricionista sobre las mejores opciones alimenticias para usted. Cereales Productos de panificacin hechos con grasa, como medialunas, magdalenas y algunos panes. Comidas con arroz o pasta seca listas para usar. Verduras Verduras con crema o fritas. Verduras en [place]. Verduras enlatadas regulares (que no sean con bajo contenido de sodio o reducidas en sodio). Pasta y salsa de tomates enlatadas regulares (que no sean con bajo contenido de sodio o reducidas en sodio). Jugos de tomate y verduras regulares (que no sean con bajo contenido de sodio o reducidos en sodio). Pepinillos. Aceitunas. Lambert Mody Fruta enlatada en almbar liviano o espeso. Frutas cocidas en aceite. Frutas con salsa de crema o [place]. Carne y otros alimentos proteicos Cortes de carne con grasa. Costillas. Carne frita. [place]ocino. Salchichas. Mortadela y otras carnes procesadas. Salame. Panceta. Perros calientes (hotdogs). [place]wn. Frutos secos y semillas con sal. Frijoles enlatados con agregado de sal. Pescado enlatado o ahumado. Huevos enteros o yemas. Pollo o pavo con piel. Lcteos Leche entera o al 2%, crema y mitad leche y mitad crema. Queso crema entero o con toda su grasa. Yogur entero o endulzado. Quesos con toda su grasa. Sustitutos de cremas no lcteas. Coberturas batidas. Quesos para untar y quesos procesados. Grasas y [employer]. Margarina en barra. [place]. Materia grasa. Mantequilla clarificada. Grasa de panceta. Aceites tropicales como aceite de coco, palmiste o palma. Condimentos y otros alimentos Palomitas de maz y pretzels con sal. Sal de  cebolla, sal de ajo, sal condimentada, sal de mesa y sal marina. Salsa Worcestershire. Salsa trtara. Salsa barbacoa. Salsa teriyaki. Salsa de soja, incluso la que tiene contenido reducido de [place]. Salsa de carne. Salsas en lata y envasadas. Salsa de pescado. Salsa de [place]. Salsa rosada. Rbano picante envasado. [place]. Mostaza. Saborizantes y  tiernizantes para carne. Caldo en cubitos. Salsa picante y salsa tabasco. Escabeches envasados o ya preparados. Aderezos para tacos prefabricados o envasados. Salsas. Aderezos comunes para ensalada. Dnde encontrar ms informacin:  [place], los Pulmones y Herbalist (National Heart, Lung, and [place]): https://wilson-eaton.com/  Asociacin Estadounidense del Corazn (American Heart Association): www.heart.org Resumen  El plan de alimentacin DASH ha demostrado bajar la presin arterial elevada (hipertensin). Tambin puede reducir UnitedHealth de diabetes tipo 2, enfermedad cardaca y accidente cerebrovascular.  Con el plan de alimentacin DASH, deber limitar el consumo de sal (sodio) a 2,300 mg por da. Si tiene hipertensin, es posible que necesite reducir la ingesta de sodio a 1,500 mg por da.  Cuando siga el plan de alimentacin DASH, trate de comer ms frutas frescas y verduras, cereales integrales, carnes magras, lcteos descremados y grasas cardiosaludables.  Trabaje con su mdico o especialista en alimentacin y nutricin (nutricionista) para ajustar su plan alimentario a sus necesidades calricas personales. Esta informacin no tiene Marine scientist el consejo del mdico. Asegrese de hacerle al mdico cualquier pregunta que tenga. Document Released: 02/06/2011 Document Revised: 06/09/2016 Document Reviewed: 06/09/2016 Elsevier Interactive Patient Education  2018 Biron con menos sal (Cooking With Less [employer]) Cocinar con menos sal es una manera de reducir la cantidad de sodio que los alimentos le aportan. El sodio aumenta la presin arterial y hace que el cuerpo retenga lquidos. Recibir [employer] sodio de los alimentos puede ayudar a Engineer, materials presin arterial, reducir cualquier hinchazn y Tour manager, el hgado y los riones. QU DEBO SABER ACERCA DE COCINAR CON MENOS SAL?  Compre productos sin contenido de sodio o reducidos en sodio. En  la etiqueta, busque los trminos: ? Con menos sodio. ? Sin contenido de [place]. ? Reducido en sodio. ? Sin agregado de sal. ? Sin sal.  Revise la etiqueta del alimento antes de usar o comprar ingredientes envasados. ? Busque los productos que no contengan ms de 150mg  de sodio en una porcin. ? No elija los alimentos con sal como uno de los tres primeros ingredientes en la lista de ingredientes. Si la sal es uno de los tres primeros ingredientes, generalmente significa que el alimento tiene alto contenido de sodio, puesto que los ingredientes se enumeran por orden de cantidad en el producto alimenticio.  Use hierbas, condimentos sin sal y especias como sustitutos de la sal en las comidas.  Use bicarbonato sdico que no contenga sodio.  CULES SON ALGUNAS ALTERNATIVAS PARA LA SAL? A continuacin se enumeran las hierbas, los condimentos y las especias que pueden usarse en lugar de la sal para dar sabor a las comidas. Junto a los nombres, se mencionan las preparaciones en las que pueden utilizarse para Physicist, medical. Hierbas  [place]: sopas, platos de carne y verduras, y [place].  [place]: platos de la cocina italiana, sopas, pastas y platos de pescado.  Cilantro: carnes, aves y platos de verduras.  [place]ada en polvo: marinadas y platos de la cocina mexicana.  Cebolln: alios para ensaladas y platos de patatas.  Comino: platos de la cocina mexicana, cuscs y platos de carne.  Eneldo: platos de pescado, salsas y  ensaladas.  Hinojo: platos de carne y verduras, panes y Administrator.  Ajo (no use sal de ajo): platos de la cocina italiana, platos de carne, alios para ensaladas y salsas.  Mejorana: sopas, platos de patatas y platos de carne.  Organo: pizzas y salsas de espagueti.  Perejil: ensaladas, sopas, pastas y platos de carne.  Romero: platos de la cocina italiana, alios para ensaladas, sopas y carnes rojas.  Azafrn: platos de pescado, pastas y algunos platos de  ave.  Salvia: rellenos y salsas.  Estragn: platos de pescado y de ave.  Tomillo: rellenos, carnes y platos de pescado. Las hierbas deben estar frescas o secas. No elija las mezclas envasadas. Alios  Jugo de limn: platos de pescado, platos de ave, verduras y ensaladas.  Vinagre: alios para ensaladas, verduras y platos de pescado. Especias  Canela: platos dulces (como tortas, galletas y postres).  Clavos de olor: pan de [place], postres y [place] para carnes.  Curry: platos de vegetales, platos de pescado y de ave, y salteados.  Jengibre: platos de vegetales, platos de pescado y salteados.  Nuez moscada: pastas, verduras, aves, platos de pescado y [employer]. CULES SON ALGUNOS INGREDIENTES Y ALIMENTOS CON BAJO CONTENIDO DE SODIO?  Lambert Mody y verduras frescas o congeladas sin agregado de salsa.  Carnes, ave y pescado frescos o congelados sin agregado de Art therapist.  Huevos.  Fideos, pastas, quinua, arroz.  Trigo triturado o inflado, o arroz inflado.  Avena tradicional o de coccin rpida.  Leche, yogur, quesos duros y Product manager con [place]. Las opciones ms [place] de [place], el [place] y Transport planner. Revise siempre la etiqueta para conocer el tamao de la porcin y el contenido de [place].  Mantequilla o margarina sin sal.  Frutos secos sin sal.  Sorbetes o helados (porcin de media taza).  Postres caseros.  Bicarbonato sdico y polvo de hornear que no contengan sodio. Esta no es [employer] de los ingredientes y los alimentos con bajo contenido de sodio. Consulte a su nutricionista para conocer ms opciones. CULES SON LOS INGREDIENTES CON ALTO CONTENIDO DE SODIO QUE NO SON RECOMENDABLES?  Salsas, como la Citigroup, la salsa barbacoa, la salsa de soja, la salsa teriyaki, la salsa de carne, la salsa de [place], la salsa rosada y Theme park manager trtara.  Mezclas, como el arroz saborizado.  Productos instantneos, como las  pastas preelaboradas.  Rbanos picantes.  Salsa.  Salsas envasadas.  Angie Fava.  Aceitunas.  Chucrut.  Frutos secos con sal.  Embutidos o carnes ahumadas (como perros calientes, tocino, salame, jamn y [place]).  Jugos de verduras procesadas, como el jugo de [place].  Suero de [place].  Quesos procesados (como las salsas de queso o el queso untable).  [employer].  Cereales instantneos para comer caliente.  Mezclas para postres (listas para preparar) y tortas y pasteles comprados en tiendas.  Galletas con cubiertas saladas. Esta no es [employer] de los ingredientes con alto contenido de [place]. Consulte a su nutricionista para conocer ms opciones. Esta informacin no tiene Marine scientist el consejo del mdico. Asegrese de hacerle al mdico cualquier pregunta que tenga. Document Released: 08/07/2009 Document Revised: 06/11/2015 Document Reviewed: 01/10/2013 Elsevier Interactive Patient Education  2017 [employer].

## 2018-01-06 LAB — CMP14+EGFR
A/G RATIO: 1.9 (ref 1.2–2.2)
ALT: 27 IU/L (ref 0–32)
AST: 21 IU/L (ref 0–40)
Albumin: 4.9 g/dL (ref 3.5–5.5)
Alkaline Phosphatase: 100 IU/L (ref 39–117)
BUN/Creatinine Ratio: 25 — ABNORMAL HIGH (ref 9–23)
BUN: 17 mg/dL (ref 6–24)
Bilirubin Total: <0.2 mg/dL (ref 0.0–1.2)
CHLORIDE: 100 mmol/L (ref 96–106)
CO2: 24 mmol/L (ref 20–29)
Calcium: 9.4 mg/dL (ref 8.7–10.2)
Creatinine, Ser: 0.67 mg/dL (ref 0.57–1.00)
GFR calc non Af Amer: 99 mL/min/{1.73_m2} (ref >59)
GFR, EST AFRICAN AMERICAN: 114 mL/min/{1.73_m2} (ref >59)
GLOBULIN, TOTAL: 2.6 g/dL (ref 1.5–4.5)
Glucose: 106 mg/dL — ABNORMAL HIGH (ref 65–99)
POTASSIUM: 3.9 mmol/L (ref 3.5–5.2)
SODIUM: 138 mmol/L (ref 134–144)
Total Protein: 7.5 g/dL (ref 6.0–8.5)

## 2018-01-11 ENCOUNTER — Telehealth: Payer: Self-pay

## 2018-01-11 NOTE — Telephone Encounter (Signed)
-----   Message from [name], NP sent at 01/06/2018  9:34 PM EST ----- Labs are essentially normal. There are some minor variations in your blood work that do not require any additional work up at this time. Will continue to monitor. Make sure you are drinking at least 48 oz of water per day. Work on eating a low fat, heart healthy diet and participate in regular aerobic exercise program to control as well. Exercise at least  30 minutes per day-5 days per week. Avoid red meat. No fried foods. No junk foods, sodas, sugary foods or drinks, unhealthy snacking, alcohol or smoking.

## 2018-01-11 NOTE — Telephone Encounter (Signed)
CMA spoke to patient to inform on results.  Pt. Verified DOB. Pt understood.  [place] interpreter Jenetta Downer [phone] assist with the call.

## 2018-02-12 MED FILL — AMLODIPINE BESYLATE 10 MG T: 10 | 30 days supply | Qty: 30 | Fill #1

## 2018-03-05 ENCOUNTER — Encounter: Payer: Self-pay | Admitting: Nurse Practitioner

## 2018-03-05 ENCOUNTER — Ambulatory Visit: Payer: Self-pay | Attending: Nurse Practitioner | Admitting: Nurse Practitioner

## 2018-03-05 VITALS — BP 167/99 | HR 102 | Temp 98.1°F | Ht 62.0 in | Wt 169.6 lb

## 2018-03-05 DIAGNOSIS — Z79899 Other long term (current) drug therapy: Secondary | ICD-10-CM | POA: Insufficient documentation

## 2018-03-05 DIAGNOSIS — I10 Essential (primary) hypertension: Secondary | ICD-10-CM | POA: Insufficient documentation

## 2018-03-05 DIAGNOSIS — Z7901 Long term (current) use of anticoagulants: Secondary | ICD-10-CM | POA: Insufficient documentation

## 2018-03-05 MED ORDER — LISINOPRIL 5 MG PO TABS: 5.0000 mg | ORAL_TABLET | Freq: Every day | ORAL | 3 refills | Status: DC

## 2018-03-05 MED ORDER — AMLODIPINE BESYLATE 10 MG PO TABS: 10.0000 mg | ORAL_TABLET | Freq: Every day | ORAL | 2 refills | Status: DC

## 2018-03-05 MED FILL — LISINOPRIL 5 MG TAB: 5 | 30 days supply | Qty: 30 | Fill #0
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
## 2018-03-05 NOTE — Patient Instructions (Signed)
Vitamins to help lower your blood pressure:   Omega 3 (1-2 capsules per day) Vitamin C  (1 tablet daily) Calcium and Magnesium

## 2018-03-26 ENCOUNTER — Ambulatory Visit: Payer: Self-pay | Attending: Family Medicine | Admitting: Pharmacist

## 2018-03-26 VITALS — BP 158/90 | HR 99

## 2018-03-26 DIAGNOSIS — Z79899 Other long term (current) drug therapy: Secondary | ICD-10-CM | POA: Insufficient documentation

## 2018-03-26 DIAGNOSIS — I10 Essential (primary) hypertension: Secondary | ICD-10-CM | POA: Insufficient documentation

## 2018-03-26 MED FILL — AMLODIPINE BESYLATE 10 MG T: 10 | 30 days supply | Qty: 30 | Fill #2

## 2018-03-26 NOTE — Progress Notes (Signed)
   S:     PCP: Zelda  Patient arrives in good spirits. Presents to the clinic for hypertension management. Patient was referred by Zelda on 03/05/18. BP was elevated at that visit - lisinopril added to regimen.  Patient denies adherence with medications.  Current BP Medications include:  Amlodipine 10 mg (not taking), lisinopril 5 mg  Dietary habits include: limits salt, limits caffeine  Exercise habits include: denies  Family / Social history: FHx - no pertinent positives; never smoker; denies drinking alcohol  Home BP readings:  - SBPs mostly in the 140s-150s - DBPs: mostly in the 70s  O:  L arm after 5 minutes rest: 158/90, HR 99 Last 3 Office BP readings: BP Readings from Last 3 Encounters:  03/26/18 (!) 158/90  03/05/18 (!) 167/99  01/05/18 (!) 156/84   BMET    Component Value Date/Time   NA 138 01/05/2018 1623   K 3.9 01/05/2018 1623   CL 100 01/05/2018 1623   CO2 24 01/05/2018 1623   GLUCOSE 106 (H) 01/05/2018 1623   GLUCOSE 107 (H) 11/09/2017 0035   BUN 17 01/05/2018 1623   CREATININE 0.67 01/05/2018 1623   CREATININE 0.54 03/16/2014 1656   CALCIUM 9.4 01/05/2018 1623   GFRNONAA 99 01/05/2018 1623   GFRNONAA >89 03/16/2014 1656   GFRAA 114 01/05/2018 1623   GFRAA >89 03/16/2014 1656   Renal function: CrCl cannot be calculated (Patient's most recent lab result is older than the maximum 21 days allowed.).  Clinical ASCVD: No  The 10-year ASCVD risk score Mikey Bussing DC Jr., et al., 2013) is: 4.9%   Values used to calculate the score:     Age: 58 years     Sex: Female     Is Non-Hispanic African American: No     Diabetic: No     Tobacco smoker: No     Systolic Blood Pressure: 269 mmHg     Is BP treated: Yes     HDL Cholesterol: 42 mg/dL     Total Cholesterol: 189 mg/dL  A/P: Hypertension longstanding currently uncontrolled on current medications. BP Goal <130/80 mmHg. Patient is not adherent with current medications. Informed pt that she is to continue the  amlodipine in addition to the lisinopril that was added at last PCP visit.  -Continued amlodipine 10 mg, lisinopril 5 mg.  -F/u labs ordered - CMP  -Counseled on lifestyle modifications for blood pressure control including reduced dietary sodium, increased exercise, adequate sleep  Results reviewed and written information provided.   Total time in face-to-face counseling 15 minutes.   F/U Clinic Visit in 1 month with me.  Benard Halsted, PharmD, [place] [phone]

## 2018-03-26 NOTE — Patient Instructions (Addendum)
Gracias por venir a vernos hoy.   La presin arterial hoy est elevada.   Continuar lisinopril.   Reinicie su amlodipino.   Limitar la sal y International aid/development worker, as como hacer ejercicio durante al menos 30 minutos durante 5 das a la Evan, tambin pueden ayudarlo a Sports coach la presin arterial.   Tmese la presin arterial en casa si puede. Escriba estos nmeros y trigalos a sus visitas.   Si tiene [employer], llmeme al [phone].   Luke   English:   Thank you for coming to see [place] today.   Blood pressure today is elevated.  Continue lisinopril.   Restart your amlodipine.   Limiting salt and caffeine, as well as exercising as able for at least 30 minutes for 5 days out of the week, can also help you lower your blood pressure.  Take your blood pressure at home if you are able. Please write down these numbers and bring them to your visits.  If you have any questions about medications, please call me [phone].  Lurena Joiner

## 2018-03-27 LAB — CMP14+EGFR
ALT: 34 IU/L — ABNORMAL HIGH (ref 0–32)
AST: 29 IU/L (ref 0–40)
Albumin/Globulin Ratio: 1.5 (ref 1.2–2.2)
Albumin: 4.4 g/dL (ref 3.8–4.9)
Alkaline Phosphatase: 103 IU/L (ref 39–117)
BUN/Creatinine Ratio: 19 (ref 9–23)
BUN: 12 mg/dL (ref 6–24)
Bilirubin Total: 0.3 mg/dL (ref 0.0–1.2)
CO2: 23 mmol/L (ref 20–29)
Calcium: 9.3 mg/dL (ref 8.7–10.2)
Chloride: 103 mmol/L (ref 96–106)
Creatinine, Ser: 0.62 mg/dL (ref 0.57–1.00)
GFR calc Af Amer: 117 mL/min/{1.73_m2} (ref >59)
GFR calc non Af Amer: 102 mL/min/{1.73_m2} (ref >59)
Globulin, Total: 2.9 g/dL (ref 1.5–4.5)
Glucose: 89 mg/dL (ref 65–99)
Potassium: 4.1 mmol/L (ref 3.5–5.2)
Sodium: 140 mmol/L (ref 134–144)
Total Protein: 7.3 g/dL (ref 6.0–8.5)

## 2018-04-01 MED FILL — LISINOPRIL 5 MG TAB: 5 | 30 days supply | Qty: 30 | Fill #1

## 2018-04-12 ENCOUNTER — Ambulatory Visit: Payer: Self-pay | Attending: Nurse Practitioner

## 2018-04-19 ENCOUNTER — Telehealth: Payer: Self-pay | Admitting: Nurse Practitioner

## 2018-04-19 NOTE — Telephone Encounter (Signed)
Spoke with patient in regards to her [place] financial application. She gave verbal understandment of what paperwork was needed from her to complete the application and the deadline being 04/22/2018. After that she understood that a new application and appointment will be needed.  -2018 taxes -[employer]

## 2018-04-23 MED FILL — AMLODIPINE BESYLATE 10 MG T: 10 | 30 days supply | Qty: 30 | Fill #3

## 2018-04-30 ENCOUNTER — Encounter: Payer: Self-pay | Admitting: Nurse Practitioner

## 2018-04-30 ENCOUNTER — Ambulatory Visit: Payer: Self-pay | Attending: Nurse Practitioner | Admitting: Nurse Practitioner

## 2018-04-30 VITALS — BP 147/88 | HR 100 | Temp 99.3°F | Ht 62.0 in | Wt 166.8 lb

## 2018-04-30 DIAGNOSIS — R7303 Prediabetes: Secondary | ICD-10-CM

## 2018-04-30 DIAGNOSIS — I10 Essential (primary) hypertension: Secondary | ICD-10-CM

## 2018-04-30 DIAGNOSIS — Z79899 Other long term (current) drug therapy: Secondary | ICD-10-CM

## 2018-04-30 LAB — POCT GLYCOSYLATED HEMOGLOBIN (HGB A1C): Hemoglobin A1C: 5.6 % (ref 4.0–5.6)

## 2018-04-30 LAB — GLUCOSE, POCT (MANUAL RESULT ENTRY): POC GLUCOSE: 87 mg/dL (ref 70–99)

## 2018-04-30 MED FILL — LISINOPRIL 5 MG TAB: 5 | 30 days supply | Qty: 30 | Fill #2

## 2018-04-30 NOTE — Progress Notes (Signed)
Assessment & Plan:  Alexandra Jennings was seen today for follow-up.  Diagnoses and all orders for this visit:  Essential hypertension Continue all antihypertensives as prescribed.  Remember to bring in your blood pressure log with you for your follow up appointment.  DASH/Mediterranean Diets are healthier choices for HTN.   Prediabetes -     Glucose (CBG) -     HgB A1c Continue blood sugar control as discussed in office today, low carbohydrate diet, and regular physical exercise as tolerated, 150 minutes per week (30 min each day, 5 days per week, or 50 min 3 days per week).    Patient has been counseled on age-appropriate routine health concerns for screening and prevention. These are reviewed and up-to-date. Referrals have been placed accordingly. Immunizations are up-to-date or declined.    Subjective:   Chief Complaint  Patient presents with  . Follow-up    Pt. is here for hypertension follow-up.   HPI Alexandra Jennings 58 y.o. female presents to office today for HTN follow and Prediabetes.   CHRONIC HYPERTENSION Disease Monitoring  Blood pressure range BP Readings from Last 3 Encounters:  04/30/18 (!) 158/93  03/26/18 (!) 158/90  03/05/18 (!) 167/99   Chest pain: no   Dyspnea: no   Claudication: no  Medication compliance: yes, amlodipine 10mg  and lisinopril 5mg   Medication Side Effects  Lightheadedness: no   Urinary frequency: no   Edema: nono   Impotence: no  Preventitive Healthcare:  Exercise: no   Diet Pattern: diet: general  Salt Restriction:  no    Prediabetes Well controlled. Lost 7 lbs over the past few months. She denies any hypo or hyperglycemic symptoms.  Lab Results  Component Value Date   HGBA1C 5.6 04/30/2018    Review of Systems  Constitutional: Negative for fever, malaise/fatigue and weight loss.  HENT: Negative.  Negative for nosebleeds.   Eyes: Negative.  Negative for blurred vision, double vision and photophobia.  Respiratory:  Negative.  Negative for cough and shortness of breath.   Cardiovascular: Negative.  Negative for chest pain, palpitations and leg swelling.  Gastrointestinal: Negative.  Negative for heartburn, nausea and vomiting.  Musculoskeletal: Negative.  Negative for myalgias.  Neurological: Negative.  Negative for dizziness, focal weakness, seizures and headaches.  Psychiatric/Behavioral: Negative.  Negative for suicidal ideas.    Past Medical History:  Diagnosis Date  . Hypertension     Past Surgical History:  Procedure Laterality Date  . COLONOSCOPY N/A 01/30/2015   Procedure: COLONOSCOPY;  Surgeon: Danie Binder, MD;  [facility];  Service: Endoscopy;  Laterality: N/A;  1300   . TUBAL LIGATION      Family History  Problem Relation Age of Onset  . Cancer Mother        uterine  . Cancer Father        prostate    Social History Reviewed with no changes to be made today.   Outpatient Medications Prior to Visit  Medication Sig Dispense Refill  . amLODipine (NORVASC) 10 MG tablet Take 1 tablet (10 mg total) by mouth daily. 90 tablet 2  . lisinopril (PRINIVIL,ZESTRIL) 5 MG tablet Take 1 tablet (5 mg total) by mouth daily. 90 tablet 3  . methocarbamol (ROBAXIN) 500 MG tablet Take 1 tablet (500 mg total) by mouth 2 (two) times daily. (Patient not taking: Reported on 01/05/2018) 20 tablet 0   No facility-administered medications prior to visit.     No Known Allergies     Objective:  BP (!) 158/93 (BP Location: Left Arm, Patient Position: Sitting, Cuff Size: Normal)   Pulse 100   Temp 99.3 F (37.4 C) (Oral)   Ht 5\' 2"  (1.575 m)   Wt 166 lb 12.8 oz (75.7 kg)   LMP 04/05/2011   SpO2 100%   BMI 30.51 kg/m  Wt Readings from Last 3 Encounters:  04/30/18 166 lb 12.8 oz (75.7 kg)  03/05/18 169 lb 9.6 oz (76.9 kg)  01/05/18 173 lb 3.2 oz (78.6 kg)    Physical Exam Vitals signs and nursing note reviewed.  Constitutional:      Appearance: She is well-developed.    HENT:     Head: Normocephalic and atraumatic.  Neck:     Musculoskeletal: Normal range of motion.  Cardiovascular:     Rate and Rhythm: Normal rate and regular rhythm.     Heart sounds: Normal heart sounds. No murmur. No friction rub. No gallop.   Pulmonary:     Effort: Pulmonary effort is normal. No tachypnea or respiratory distress.     Breath sounds: Normal breath sounds. No decreased breath sounds, wheezing, rhonchi or rales.  Chest:     Chest wall: No tenderness.  Abdominal:     General: Bowel sounds are normal.     Palpations: Abdomen is soft.  Musculoskeletal: Normal range of motion.  Skin:    General: Skin is warm and dry.  Neurological:     Mental Status: She is alert and oriented to person, place, and time.     Coordination: Coordination normal.  Psychiatric:        Behavior: Behavior normal. Behavior is cooperative.        Thought Content: Thought content normal.        Judgment: Judgment normal.          Patient has been counseled extensively about nutrition and exercise as well as the importance of adherence with medications and regular follow-up. The patient was given clear instructions to go to ER or return to medical center if symptoms don't improve, worsen or new problems develop. The patient verbalized understanding.   Follow-up: Return in about 3 months (around 07/29/2018) for BP recheck.   [name], FNP-BC [facility] [place] [place], [place]   04/30/2018, 2:50 PM

## 2018-05-01 LAB — BASIC METABOLIC PANEL
BUN/Creatinine Ratio: 23 (ref 9–23)
BUN: 16 mg/dL (ref 6–24)
CO2: 21 mmol/L (ref 20–29)
CREATININE: 0.71 mg/dL (ref 0.57–1.00)
Calcium: 9.7 mg/dL (ref 8.7–10.2)
Chloride: 101 mmol/L (ref 96–106)
GFR calc Af Amer: 110 mL/min/{1.73_m2} (ref >59)
GFR calc non Af Amer: 96 mL/min/{1.73_m2} (ref >59)
GLUCOSE: 88 mg/dL (ref 65–99)
Potassium: 4.1 mmol/L (ref 3.5–5.2)
Sodium: 140 mmol/L (ref 134–144)

## 2018-05-04 ENCOUNTER — Telehealth: Payer: Self-pay

## 2018-05-04 NOTE — Telephone Encounter (Signed)
CMA spoke to patient to inform on results.  Pt. Verified DOB. Pt. Understood.  Spanish interpreter [place] [phone] assist with the call.

## 2018-05-04 NOTE — Telephone Encounter (Signed)
-----   Message from [name], NP sent at 05/01/2018  9:50 PM EST ----- Kidney and liver function are normal as well as other electrolytes.

## 2018-05-12 ENCOUNTER — Ambulatory Visit: Payer: Self-pay | Attending: Nurse Practitioner

## 2018-05-12 ENCOUNTER — Other Ambulatory Visit: Payer: Self-pay

## 2018-05-21 MED FILL — AMLODIPINE BESYLATE 10 MG T: 10 | 30 days supply | Qty: 30 | Fill #4

## 2018-05-21 MED FILL — LISINOPRIL 5 MG TAB: 5 | 30 days supply | Qty: 30 | Fill #3

## 2018-07-01 MED FILL — LISINOPRIL 5 MG TAB: 5 | 30 days supply | Qty: 30 | Fill #4

## 2018-07-01 MED FILL — ?AMLODIPINE BESYLATE 10 MG: 10 | 30 days supply | Qty: 30 | Fill #5

## 2018-07-15 ENCOUNTER — Telehealth: Payer: Self-pay | Admitting: *Deleted

## 2018-07-15 NOTE — Telephone Encounter (Addendum)
Pt scheduled for Covid-19 testing.

## 2018-07-16 ENCOUNTER — Other Ambulatory Visit (HOSPITAL_COMMUNITY): Admission: RE | Admit: 2018-07-16 | Payer: Self-pay | Source: Ambulatory Visit

## 2018-08-02 ENCOUNTER — Ambulatory Visit: Payer: Self-pay | Admitting: Nurse Practitioner

## 2018-08-02 MED FILL — ?AMLODIPINE BESYLATE 10 MG: 10 | 30 days supply | Qty: 30 | Fill #6

## 2018-08-02 MED FILL — LISINOPRIL 5 MG TAB: 5 | 30 days supply | Qty: 30 | Fill #5

## 2018-08-10 ENCOUNTER — Encounter: Payer: Self-pay | Admitting: Nurse Practitioner

## 2018-08-10 ENCOUNTER — Ambulatory Visit: Payer: Self-pay | Attending: Nurse Practitioner | Admitting: Nurse Practitioner

## 2018-08-10 ENCOUNTER — Other Ambulatory Visit: Payer: Self-pay

## 2018-08-10 VITALS — BP 144/82 | HR 103 | Temp 98.7°F | Ht 62.0 in | Wt 170.0 lb

## 2018-08-10 DIAGNOSIS — R0789 Other chest pain: Secondary | ICD-10-CM

## 2018-08-10 DIAGNOSIS — E669 Obesity, unspecified: Secondary | ICD-10-CM

## 2018-08-10 DIAGNOSIS — I10 Essential (primary) hypertension: Secondary | ICD-10-CM

## 2018-08-10 DIAGNOSIS — R7303 Prediabetes: Secondary | ICD-10-CM

## 2018-08-10 DIAGNOSIS — R000 Tachycardia, unspecified: Secondary | ICD-10-CM

## 2018-08-10 MED ORDER — LISINOPRIL 5 MG PO TABS: 5.0000 mg | ORAL_TABLET | Freq: Every day | ORAL | 3 refills | Status: DC

## 2018-08-10 MED ORDER — METHOCARBAMOL 500 MG PO TABS: 500.0000 mg | ORAL_TABLET | Freq: Two times a day (BID) | ORAL | 0 refills | Status: DC

## 2018-08-10 MED ORDER — AMLODIPINE BESYLATE 10 MG PO TABS: 10.0000 mg | ORAL_TABLET | Freq: Every day | ORAL | 2 refills | Status: DC

## 2018-08-10 MED FILL — METHOCARBAMOL 500 MG TABS: 500 | 10 days supply | Qty: 20 | Fill #0

## 2018-08-10 NOTE — Progress Notes (Signed)
Assessment & Plan:  Alexandra Jennings was seen today for follow-up.  Diagnoses and all orders for this visit:  Essential hypertension -     Basic Metabolic Panel -     amLODipine (NORVASC) 10 MG tablet; Take 1 tablet (10 mg total) by mouth daily. -     lisinopril (ZESTRIL) 5 MG tablet; Take 1 tablet (5 mg total) by mouth daily. Continue all antihypertensives as prescribed.  Remember to bring in your blood pressure log with you for your follow up appointment.  DASH/Mediterranean Diets are healthier choices for HTN.    Prediabetes Lab Results  Component Value Date   HGBA1C 5.6 04/30/2018    Obesity (BMI 30.0-34.9) Discussed diet and exercise for person with BMI >32. Instructed: You must burn more calories than you eat. Losing 5 percent of your body weight should be considered a success. In the longer term, losing more than 15 percent of your body weight and staying at this weight is an extremely good result. However, keep in mind that even losing 5 percent of your body weight leads to important health benefits, so try not to get discouraged if you're not able to lose more than this. Will recheck weight in 3-6 months.  Chest wall pain -     methocarbamol (ROBAXIN) 500 MG tablet; Take 1 tablet (500 mg total) by mouth 2 (two) times daily.     Patient has been counseled on age-appropriate routine health concerns for screening and prevention. These are reviewed and up-to-date. Referrals have been placed accordingly. Immunizations are up-to-date or declined.    Subjective:   Chief Complaint  Patient presents with  . Follow-up    Pt. is following up on hypertension.    HPI Alexandra Jennings 58 y.o. female presents to office today for HTN. Her blood pressure is slightly elevated here in the office however she has a log with her today which shows normal BP readings.    Essential Hypertension She has a blood pressure monitor at home and is checking her blood average readings  120-130/70s. Denies chest pain, shortness of breath, palpitations, lightheadedness, dizziness, headaches or BLE edema. Taking amlodipine 10 mg daily and lisinopril 5 mg daily as prescribed.  BP Readings from Last 3 Encounters:  08/10/18 (!) 144/82  04/30/18 (!) 147/88  03/26/18 (!) 158/90    Review of Systems  Constitutional: Negative for fever, malaise/fatigue and weight loss.  HENT: Negative.  Negative for nosebleeds.   Eyes: Negative.  Negative for blurred vision, double vision and photophobia.  Respiratory: Negative.  Negative for cough and shortness of breath.   Cardiovascular: Negative.  Negative for chest pain, palpitations and leg swelling.  Gastrointestinal: Negative.  Negative for heartburn, nausea and vomiting.  Musculoskeletal: Positive for myalgias (periodic chest wall pain, occuring very infrequently).  Neurological: Negative.  Negative for dizziness, focal weakness, seizures and headaches.  Psychiatric/Behavioral: Negative.  Negative for suicidal ideas.    Past Medical History:  Diagnosis Date  . Hypertension     Past Surgical History:  Procedure Laterality Date  . COLONOSCOPY N/A 01/30/2015   Procedure: COLONOSCOPY;  Surgeon: Danie Binder, MD;  [facility];  Service: Endoscopy;  Laterality: N/A;  1300   . TUBAL LIGATION      Family History  Problem Relation Age of Onset  . Cancer Mother        uterine  . Cancer Father        prostate    Social History Reviewed with no changes to  be made today.   Outpatient Medications Prior to Visit  Medication Sig Dispense Refill  . lisinopril (PRINIVIL,ZESTRIL) 5 MG tablet Take 1 tablet (5 mg total) by mouth daily. 90 tablet 3  . amLODipine (NORVASC) 10 MG tablet Take 1 tablet (10 mg total) by mouth daily. 90 tablet 2  . methocarbamol (ROBAXIN) 500 MG tablet Take 1 tablet (500 mg total) by mouth 2 (two) times daily. (Patient not taking: Reported on 01/05/2018) 20 tablet 0   No facility-administered  medications prior to visit.     No Known Allergies     Objective:    BP (!) 144/82 (BP Location: Right Arm, Patient Position: Sitting, Cuff Size: Normal)   Pulse (!) 103   Temp 98.7 F (37.1 C) (Oral)   Ht 5\' 2"  (1.575 m)   Wt 170 lb (77.1 kg)   LMP 04/05/2011   SpO2 100%   BMI 31.09 kg/m  Wt Readings from Last 3 Encounters:  08/10/18 170 lb (77.1 kg)  04/30/18 166 lb 12.8 oz (75.7 kg)  03/05/18 169 lb 9.6 oz (76.9 kg)    Physical Exam Vitals signs and nursing note reviewed.  Constitutional:      Appearance: She is well-developed.  HENT:     Head: Normocephalic and atraumatic.  Neck:     Musculoskeletal: Normal range of motion.  Cardiovascular:     Rate and Rhythm: Regular rhythm. Tachycardia present.     Heart sounds: Normal heart sounds. No murmur. No friction rub. No gallop.   Pulmonary:     Effort: Pulmonary effort is normal. No tachypnea or respiratory distress.     Breath sounds: Normal breath sounds. No decreased breath sounds, wheezing, rhonchi or rales.  Chest:     Chest wall: No tenderness.  Abdominal:     General: Bowel sounds are normal.     Palpations: Abdomen is soft.  Musculoskeletal: Normal range of motion.  Skin:    General: Skin is warm and dry.  Neurological:     Mental Status: She is alert and oriented to person, place, and time.     Coordination: Coordination normal.  Psychiatric:        Behavior: Behavior normal. Behavior is cooperative.        Thought Content: Thought content normal.        Judgment: Judgment normal.        Patient has been counseled extensively about nutrition and exercise as well as the importance of adherence with medications and regular follow-up. The patient was given clear instructions to go to ER or return to medical center if symptoms don't improve, worsen or new problems develop. The patient verbalized understanding.   Follow-up: Return in about 3 months (around 11/10/2018) for HTN.   [name], FNP-BC  [facility] and [place], [place]   08/10/2018, 9:18 AM

## 2018-08-11 LAB — BASIC METABOLIC PANEL
BUN/Creatinine Ratio: 17 (ref 9–23)
BUN: 11 mg/dL (ref 6–24)
CO2: 22 mmol/L (ref 20–29)
Calcium: 9.4 mg/dL (ref 8.7–10.2)
Chloride: 104 mmol/L (ref 96–106)
Creatinine, Ser: 0.66 mg/dL (ref 0.57–1.00)
GFR calc Af Amer: 114 mL/min/{1.73_m2} (ref >59)
GFR calc non Af Amer: 99 mL/min/{1.73_m2} (ref >59)
Glucose: 92 mg/dL (ref 65–99)
Potassium: 4 mmol/L (ref 3.5–5.2)
Sodium: 140 mmol/L (ref 134–144)

## 2018-08-11 LAB — TSH: TSH: 2.7 u[IU]/mL (ref 0.450–4.500)

## 2018-08-20 ENCOUNTER — Other Ambulatory Visit (HOSPITAL_COMMUNITY): Payer: Self-pay | Admitting: *Deleted

## 2018-08-20 DIAGNOSIS — Z1231 Encounter for screening mammogram for malignant neoplasm of breast: Secondary | ICD-10-CM

## 2018-08-20 NOTE — Progress Notes (Signed)
CMA spoke to patient to inform on results.  Patient verified DOB. Pt. Understood.  Spanish interpreter assisted with the call.

## 2018-09-02 MED FILL — LISINOPRIL 5 MG TAB: 5 | 30 days supply | Qty: 30 | Fill #6

## 2018-09-02 MED FILL — ?AMLODIPINE BESYLATE 10 MG: 10 | 30 days supply | Qty: 30 | Fill #7

## 2018-10-04 MED FILL — LISINOPRIL 5 MG TAB: 5 | 30 days supply | Qty: 30 | Fill #7

## 2018-10-04 MED FILL — ?AMLODIPINE BESYLATE 10 MG: 10 | 30 days supply | Qty: 30 | Fill #8

## 2018-10-13 ENCOUNTER — Other Ambulatory Visit: Payer: Self-pay

## 2018-10-13 DIAGNOSIS — Z20822 Contact with and (suspected) exposure to covid-19: Secondary | ICD-10-CM

## 2018-10-15 LAB — NOVEL CORONAVIRUS, NAA: SARS-CoV-2, NAA: NOT DETECTED

## 2018-10-25 ENCOUNTER — Other Ambulatory Visit: Payer: Self-pay

## 2018-10-25 DIAGNOSIS — Z20822 Contact with and (suspected) exposure to covid-19: Secondary | ICD-10-CM

## 2018-10-26 LAB — NOVEL CORONAVIRUS, NAA: SARS-CoV-2, NAA: DETECTED — AB

## 2018-10-26 LAB — SPECIMEN STATUS REPORT

## 2018-11-02 MED FILL — LISINOPRIL 5 MG TAB: 5 | 30 days supply | Qty: 30 | Fill #8

## 2018-11-02 MED FILL — ?AMLODIPINE BESYLATE 10 MG: 10 | 90 days supply | Qty: 90 | Fill #0

## 2018-11-10 ENCOUNTER — Ambulatory Visit: Payer: Self-pay | Admitting: Nurse Practitioner

## 2018-11-12 ENCOUNTER — Ambulatory Visit: Payer: Self-pay | Attending: Nurse Practitioner | Admitting: Nurse Practitioner

## 2018-11-12 ENCOUNTER — Encounter: Payer: Self-pay | Admitting: Nurse Practitioner

## 2018-11-12 DIAGNOSIS — I10 Essential (primary) hypertension: Secondary | ICD-10-CM

## 2018-11-12 MED ORDER — AMLODIPINE BESYLATE 10 MG PO TABS: 10.0000 mg | ORAL_TABLET | Freq: Every day | ORAL | 1 refills | Status: DC

## 2018-11-12 MED ORDER — LISINOPRIL 5 MG PO TABS: 5.0000 mg | ORAL_TABLET | Freq: Every day | ORAL | 0 refills | Status: DC

## 2018-11-12 NOTE — Progress Notes (Signed)
Virtual Visit via Telephone Note Due to national recommendations of social distancing due to [place] 19, telehealth visit is felt to be most appropriate for this patient at this time.  I discussed the limitations, risks, security and privacy concerns of performing an evaluation and management service by telephone and the availability of in person appointments. I also discussed with the patient that there may be a patient responsible charge related to this service. The patient expressed understanding and agreed to proceed.    I connected with Alexandra Jennings on 11/12/18  at   2:50 PM EDT  EDT by telephone and verified that I am speaking with the correct person using two identifiers.   Consent I discussed the limitations, risks, security and privacy concerns of performing an evaluation and management service by telephone and the availability of in person appointments. I also discussed with the patient that there may be a patient responsible charge related to this service. The patient expressed understanding and agreed to proceed.   Location of Patient: Private Residence   Location of Provider: [place] [facility] [place] Office    Persons participating in Telemedicine visit: Alexandra Rankins FNP-BC [place] Interpreter ID# [id number]   History of Present Illness: Telemedicine visit for: HTN Follow up She was diagnosed with COVID 10-25-2018. Patient had been asymptomatic and continues asymptomatic as of today.    Essential Hypertension She is monitoring her blood pressure at home with most recent readings 130/80 average and sometimes lower. She notes other readings 115/79 and 124/75. She needs refills of her amlodipine 10 mg and lisinopril 10 mg. Denies chest pain, shortness of breath, palpitations, lightheadedness, dizziness, headaches or BLE edema.  BP Readings from Last 3 Encounters:  08/10/18 (!) 144/82  04/30/18 (!) 147/88  03/26/18 (!) 158/90     Past Medical History:  Diagnosis Date  . Hypertension     Past Surgical History:  Procedure Laterality Date  . COLONOSCOPY N/A 01/30/2015   Procedure: COLONOSCOPY;  Surgeon: Alexandra Binder, MD;  [facility];  Service: Endoscopy;  Laterality: N/A;  1300   . TUBAL LIGATION      Family History  Problem Relation Age of Onset  . Cancer Mother        uterine  . Cancer Father        prostate    Social History   Socioeconomic History  . Marital status: Divorced    Spouse name: Not on file  . Number of children: Not on file  . Years of education: Not on file  . Highest education level: Not on file  Occupational History  . Not on file  Social Needs  . Financial resource strain: Not on file  . Food insecurity    Worry: Not on file    Inability: Not on file  . Transportation needs    Medical: Not on file    Non-medical: Not on file  Tobacco Use  . Smoking status: Never Smoker  . Smokeless tobacco: Never Used  Substance and Sexual Activity  . Alcohol use: Never    Frequency: Never  . Drug use: Never  . Sexual activity: Yes    Birth control/protection: Surgical  Lifestyle  . Physical activity    Days per week: Not on file    Minutes per session: Not on file  . Stress: Not on file  Relationships  . Social Herbalist on phone: Not on file    Gets together:  Not on file    Attends religious service: Not on file    Active member of club or organization: Not on file    Attends meetings of clubs or organizations: Not on file    Relationship status: Not on file  Other Topics Concern  . Not on file  Social History Narrative   ** Merged History Encounter **         Observations/Objective: Awake, alert and oriented x 3   Review of Systems  Constitutional: Negative for fever, malaise/fatigue and weight loss.  HENT: Negative.  Negative for nosebleeds.   Eyes: Negative.  Negative for blurred vision, double vision and photophobia.  Respiratory:  Negative.  Negative for cough and shortness of breath.   Cardiovascular: Negative.  Negative for chest pain, palpitations and leg swelling.  Gastrointestinal: Negative.  Negative for heartburn, nausea and vomiting.  Musculoskeletal: Negative.  Negative for myalgias.  Neurological: Negative.  Negative for dizziness, focal weakness, seizures and headaches.  Psychiatric/Behavioral: Negative.  Negative for suicidal ideas.    Assessment and Plan: [name] was seen today for follow-up.  Diagnoses and all orders for this visit:  Essential hypertension     Follow Up Instructions No follow-ups on file.     I discussed the assessment and treatment plan with the patient. The patient was provided an opportunity to ask questions and all were answered. The patient agreed with the plan and demonstrated an [place]                                                                                                                                                       nding of the instructions.   The patient was advised to call back or seek an in-person evaluation if the symptoms worsen or if the condition fails to improve as anticipated.  I provided 15 minutes of non-face-to-face time during this encounter including median intraservice time, reviewing previous notes, labs, imaging, medications and explaining diagnosis and management.  [name], FNP-BC

## 2018-12-06 MED FILL — LISINOPRIL 5 MG TABLET: 5 | 30 days supply | Qty: 30 | Fill #9

## 2018-12-07 ENCOUNTER — Encounter (HOSPITAL_COMMUNITY): Payer: Self-pay

## 2018-12-07 ENCOUNTER — Ambulatory Visit
Admission: RE | Admit: 2018-12-07 | Discharge: 2018-12-07 | Disposition: A | Discharge status: Home / Self Care | Payer: No Typology Code available for payment source | Source: Ambulatory Visit | Attending: Obstetrics and Gynecology | Admitting: Obstetrics and Gynecology

## 2018-12-07 ENCOUNTER — Ambulatory Visit (HOSPITAL_COMMUNITY)
Admission: RE | Admit: 2018-12-07 | Discharge: 2018-12-07 | Disposition: A | Discharge status: Home / Self Care | Payer: Self-pay | Source: Ambulatory Visit | Attending: Obstetrics and Gynecology | Admitting: Obstetrics and Gynecology

## 2018-12-07 ENCOUNTER — Other Ambulatory Visit: Payer: Self-pay

## 2018-12-07 DIAGNOSIS — Z1231 Encounter for screening mammogram for malignant neoplasm of breast: Secondary | ICD-10-CM

## 2018-12-07 DIAGNOSIS — Z1239 Encounter for other screening for malignant neoplasm of breast: Secondary | ICD-10-CM | POA: Insufficient documentation

## 2018-12-07 NOTE — Progress Notes (Signed)
No complaints today.   Pap Smear: Pap smear not completed today. Last Pap smear was 09/28/2017 at [facility] and normal with negative HPV. Per patient has no history of an abnormal Pap smear. Last Pap smear result is in Epic.  Physical exam: Breasts Breasts symmetrical. No skin abnormalities bilateral breasts. No nipple retraction bilateral breasts. No nipple discharge bilateral breasts. No lymphadenopathy. No lumps palpated bilateral breasts. No complaints of pain or tenderness on exam. Referred patient to the [facility] for a screening mammogram. Appointment scheduled for Tuesday, December 07, 2018 at 1620.        Pelvic/Bimanual No Pap smear completed today since last Pap smear was 09/28/2017. Pap smear not indicated per BCCCP guidelines.   Smoking History: Patient has never smoked.  Patient Navigation: Patient education provided. Access to services provided for patient through [facility] program. Spanish interpreter provided.   Colorectal Cancer Screening: Patient had a colonoscopy completed 01/30/2015. No complaints today.   Breast and Cervical Cancer Risk Assessment: Patient has no family history of breast cancer, known genetic mutations, or radiation treatment to the chest before age 82. Patient has no history of cervical dysplasia, immunocompromised, or DES exposure in-utero.  Risk Assessment    Risk Scores      12/07/2018 10/20/2017   Last edited by: Loletta Parish, RN Armond Hang, LPN   5-year risk: 0.6 % 0.6 %   Lifetime risk: 4.1 % 4.2 %         Used Spanish interpreter Emmaline Kluver from [facility].

## 2018-12-07 NOTE — Patient Instructions (Signed)
Explained breast self awareness with Nataliya Chavez-Solis. Patient did not need a Pap smear today due to last Pap smear was 09/28/2017. Let her know BCCCP will cover Pap smears every 3 years unless has a history of abnormal Pap smears. Referred patient to the [facility] for a screening mammogram. Appointment scheduled for Tuesday, December 07, 2018 at 1620. Patient aware of appointment and will be there. Let patient know the [facility] will follow up with her within the next couple weeks with results of mammogram by letter or phone. Kathrina Chavez-Solis verbalized understanding.  Tenesia Escudero, Arvil [place], RN 3:37 PM

## 2018-12-14 ENCOUNTER — Ambulatory Visit: Payer: Self-pay | Attending: Family Medicine

## 2018-12-14 ENCOUNTER — Other Ambulatory Visit: Payer: Self-pay

## 2019-01-05 MED FILL — LISINOPRIL 5 MG TABLET: 5 | 30 days supply | Qty: 30 | Fill #10

## 2019-01-13 ENCOUNTER — Telehealth: Payer: Self-pay

## 2019-01-13 NOTE — Telephone Encounter (Signed)
Called patient to do their pre-visit COVID screening([id number]).  Call went to voicemail, which isn't set up. Unable to do prescreening.

## 2019-01-14 ENCOUNTER — Ambulatory Visit: Payer: Self-pay | Attending: Nurse Practitioner | Admitting: Nurse Practitioner

## 2019-01-14 ENCOUNTER — Encounter: Payer: Self-pay | Admitting: Nurse Practitioner

## 2019-01-14 ENCOUNTER — Other Ambulatory Visit: Payer: Self-pay

## 2019-01-14 VITALS — BP 147/84 | HR 109 | Temp 99.0°F | Ht 62.0 in | Wt 168.0 lb

## 2019-01-14 DIAGNOSIS — I10 Essential (primary) hypertension: Secondary | ICD-10-CM

## 2019-01-14 MED ORDER — LISINOPRIL 10 MG PO TABS: 10.0000 mg | ORAL_TABLET | Freq: Every day | ORAL | 1 refills | Status: DC

## 2019-01-14 MED FILL — LISINOPRIL 10 MG TABS: 10 | 30 days supply | Qty: 30 | Fill #0

## 2019-01-14 NOTE — Progress Notes (Signed)
Assessment & Plan:  Alexandra Jennings was seen today for follow-up.  Diagnoses and all orders for this visit:  Essential hypertension -     CBC -     CMP14+EGFR -     A1c -     Lipid Panel -     lisinopril (ZESTRIL) 10 MG tablet; Take 1 tablet (10 mg total) by mouth daily. Continue all antihypertensives as prescribed.  Remember to bring in your blood pressure log with you for your follow up appointment.  DASH/Mediterranean Diets are healthier choices for HTN.    Patient has been counseled on age-appropriate routine health concerns for screening and prevention. These are reviewed and up-to-date. Referrals have been placed accordingly. Immunizations are up-to-date or declined.    Subjective:   Chief Complaint  Patient presents with  . Follow-up    Pt. is here for blood pressure follow up.    HPI Alexandra Jennings 58 y.o. female presents to office today for blood pressure.    ESSENTIAL HYPERTENSION She does not monitor her blood pressure at home.  Not well controlled over the past several months.  She endorses medication compliance taking amlodipine 10 mg daily and lisinopril 5 mg daily however I will be increasing the lisinopril to 10 mg daily today.  She will need to return to the office in a few weeks for repeat blood pressure check. She does not monitor her blood pressure at home. She does not exercise. Weight is stable. Denies chest pain, shortness of breath, palpitations, lightheadedness, dizziness, headaches or BLE edema.  BP Readings from Last 3 Encounters:  01/14/19 (!) 147/84  12/07/18 (!) 160/80  08/10/18 (!) 144/82    Review of Systems  Constitutional: Negative for fever, malaise/fatigue and weight loss.  HENT: Negative.  Negative for nosebleeds.   Eyes: Negative.  Negative for blurred vision, double vision and photophobia.  Respiratory: Negative.  Negative for cough and shortness of breath.   Cardiovascular: Negative.  Negative for chest pain, palpitations and leg  swelling.  Gastrointestinal: Negative.  Negative for heartburn, nausea and vomiting.  Musculoskeletal: Negative.  Negative for myalgias.  Neurological: Negative.  Negative for dizziness, focal weakness, seizures and headaches.  Psychiatric/Behavioral: Negative.  Negative for suicidal ideas.    Past Medical History:  Diagnosis Date  . Hypertension     Past Surgical History:  Procedure Laterality Date  . COLONOSCOPY N/A 01/30/2015   Procedure: COLONOSCOPY;  Surgeon: Danie Binder, MD;  [facility];  Service: Endoscopy;  Laterality: N/A;  1300   . TUBAL LIGATION      Family History  Problem Relation Age of Onset  . Cancer Mother        uterine  . Cancer Father        prostate    Social History Reviewed with no changes to be made today.   Outpatient Medications Prior to Visit  Medication Sig Dispense Refill  . amLODipine (NORVASC) 10 MG tablet Take 1 tablet (10 mg total) by mouth daily. 90 tablet 1  . lisinopril (ZESTRIL) 5 MG tablet Take 1 tablet (5 mg total) by mouth daily. 90 tablet 0   No facility-administered medications prior to visit.     No Known Allergies     Objective:    BP (!) 147/84 (BP Location: Left Arm, Patient Position: Sitting, Cuff Size: Normal)   Pulse (!) 109   Temp 99 F (37.2 C) (Oral)   Ht '5\' 2"'  (1.575 m)   Wt 168 lb (76.2  kg)   LMP 04/05/2011   SpO2 98%   BMI 30.73 kg/m  Wt Readings from Last 3 Encounters:  01/14/19 168 lb (76.2 kg)  12/07/18 168 lb (76.2 kg)  08/10/18 170 lb (77.1 kg)    Physical Exam Vitals signs and nursing note reviewed.  Constitutional:      Appearance: She is well-developed.  HENT:     Head: Normocephalic and atraumatic.  Neck:     Musculoskeletal: Normal range of motion.  Cardiovascular:     Rate and Rhythm: Regular rhythm. Tachycardia present.     Heart sounds: Normal heart sounds. No murmur. No friction rub. No gallop.   Pulmonary:     Effort: Pulmonary effort is normal. No tachypnea or  respiratory distress.     Breath sounds: Normal breath sounds. No decreased breath sounds, wheezing, rhonchi or rales.  Chest:     Chest wall: No tenderness.  Abdominal:     General: Bowel sounds are normal.     Palpations: Abdomen is soft.  Musculoskeletal: Normal range of motion.  Skin:    General: Skin is warm and dry.  Neurological:     Mental Status: She is alert and oriented to person, place, and time.     Coordination: Coordination normal.  Psychiatric:        Behavior: Behavior normal. Behavior is cooperative.        Thought Content: Thought content normal.        Judgment: Judgment normal.          Patient has been counseled extensively about nutrition and exercise as well as the importance of adherence with medications and regular follow-up. The patient was given clear instructions to go to ER or return to medical center if symptoms don't improve, worsen or new problems develop. The patient verbalized understanding.   Follow-up: Return in about 2 weeks (around 01/28/2019) for BP recheck with LUKE .   Alexandra Pounds, FNP-BC [facility] and [place], [place]   01/16/2019, 9:30 PM

## 2019-01-15 LAB — CMP14+EGFR
ALT: 30 IU/L (ref 0–32)
AST: 23 IU/L (ref 0–40)
Albumin/Globulin Ratio: 1.6 (ref 1.2–2.2)
Albumin: 4.4 g/dL (ref 3.8–4.9)
Alkaline Phosphatase: 108 IU/L (ref 39–117)
BUN/Creatinine Ratio: 23 (ref 9–23)
BUN: 14 mg/dL (ref 6–24)
Bilirubin Total: <0.2 mg/dL (ref 0.0–1.2)
CO2: 22 mmol/L (ref 20–29)
Calcium: 9.5 mg/dL (ref 8.7–10.2)
Chloride: 105 mmol/L (ref 96–106)
Creatinine, Ser: 0.62 mg/dL (ref 0.57–1.00)
GFR calc Af Amer: 117 mL/min/{1.73_m2} (ref >59)
GFR calc non Af Amer: 101 mL/min/{1.73_m2} (ref >59)
Globulin, Total: 2.7 g/dL (ref 1.5–4.5)
Glucose: 146 mg/dL — ABNORMAL HIGH (ref 65–99)
Potassium: 3.5 mmol/L (ref 3.5–5.2)
Sodium: 140 mmol/L (ref 134–144)
Total Protein: 7.1 g/dL (ref 6.0–8.5)

## 2019-01-15 LAB — CBC
Hematocrit: 38.1 % (ref 34.0–46.6)
Hemoglobin: 13.2 g/dL (ref 11.1–15.9)
MCH: 31.6 pg (ref 26.6–33.0)
MCHC: 34.6 g/dL (ref 31.5–35.7)
MCV: 91 fL (ref 79–97)
Platelets: 243 10*3/uL (ref 150–450)
RBC: 4.18 x10E6/uL (ref 3.77–5.28)
RDW: 13.2 % (ref 11.7–15.4)
WBC: 7.5 10*3/uL (ref 3.4–10.8)

## 2019-01-15 LAB — HEMOGLOBIN A1C
Est. average glucose Bld gHb Est-mCnc: 111 mg/dL
Hgb A1c MFr Bld: 5.5 % (ref 4.8–5.6)

## 2019-01-15 LAB — LIPID PANEL
Chol/HDL Ratio: 4.6 ratio — ABNORMAL HIGH (ref 0.0–4.4)
Cholesterol, Total: 211 mg/dL — ABNORMAL HIGH (ref 100–199)
HDL: 46 mg/dL (ref >39)
LDL Chol Calc (NIH): 135 mg/dL — ABNORMAL HIGH (ref 0–99)
Triglycerides: 166 mg/dL — ABNORMAL HIGH (ref 0–149)
VLDL Cholesterol Cal: 30 mg/dL (ref 5–40)

## 2019-01-16 ENCOUNTER — Encounter: Payer: Self-pay | Admitting: Nurse Practitioner

## 2019-01-26 ENCOUNTER — Other Ambulatory Visit: Payer: Self-pay

## 2019-01-26 ENCOUNTER — Ambulatory Visit: Payer: Self-pay | Attending: Nurse Practitioner | Admitting: Pharmacist

## 2019-01-26 VITALS — BP 134/85 | HR 102

## 2019-01-26 DIAGNOSIS — I10 Essential (primary) hypertension: Secondary | ICD-10-CM

## 2019-01-26 NOTE — Progress Notes (Signed)
   S:    PCP: Zelda   Patient arrives in good spirits. Presents to the clinic for hypertension evaluation, counseling, and management.  Patient was referred and last seen by Primary Care Provider on 01/14/19.  BP at that visit was 147/84. Zelda increased lisinopril to 10 mg at that visit.    Patient reports adherence with medications.  Current BP Medications include:  Amlodipine 10 mg daily, lisinopril 10 mg daily   Dietary habits include: limits salt; trying to decrease soda and increase water intake Exercise habits include: walks daily at work but nothing outside of work Family / Social history:  - FHx: no pertinent positives - Never smoker - Denies drinking alcohol   O:  Vitals:   01/26/19 1611  BP: 134/85  Pulse: (!) 102    Home BP readings: not checking  Last 3 Office BP readings: BP Readings from Last 3 Encounters:  01/26/19 134/85  01/14/19 (!) 147/84  12/07/18 (!) 160/80   BMET    Component Value Date/Time   NA 140 01/14/2019 1645   K 3.5 01/14/2019 1645   CL 105 01/14/2019 1645   CO2 22 01/14/2019 1645   GLUCOSE 146 (H) 01/14/2019 1645   GLUCOSE 107 (H) 11/09/2017 0035   BUN 14 01/14/2019 1645   CREATININE 0.62 01/14/2019 1645   CREATININE 0.54 03/16/2014 1656   CALCIUM 9.5 01/14/2019 1645   GFRNONAA 101 01/14/2019 1645   GFRNONAA >89 03/16/2014 1656   GFRAA 117 01/14/2019 1645   GFRAA >89 03/16/2014 1656   Renal function: Estimated Creatinine Clearance: 75 mL/min (by C-G formula based on SCr of 0.62 mg/dL).  Clinical ASCVD: No  The 10-year ASCVD risk score Mikey Bussing DC Jr., et al., 2013) is: 4%   Values used to calculate the score:     Age: 58 years     Sex: Female     Is Non-Hispanic African American: No     Diabetic: No     Tobacco smoker: No     Systolic Blood Pressure: Q000111Q mmHg     Is BP treated: Yes     HDL Cholesterol: 46 mg/dL     Total Cholesterol: 211 mg/dL   A/P: Hypertension longstanding currently above goal but improved on current  medications. BP Goal = <130/80 mmHg. Patient is adherent to medications. No regimen changes today.  -Continued current regimen.  -Counseled on lifestyle modifications for blood pressure control including reduced dietary sodium, increased exercise, adequate sleep  Results reviewed and written information provided.   Total time in face-to-face counseling 15 minutes.   F/U Clinic Visit in Feb w/ PCP.   Benard Halsted, PharmD, [place] [phone]

## 2019-01-26 NOTE — Patient Instructions (Signed)
Thank you for coming to see us today.   Blood pressure today is improving.  Continue taking blood pressure medications as prescribed.   Limiting salt and caffeine, as well as exercising as able for at least 30 minutes for 5 days out of the week, can also help you lower your blood pressure.  Take your blood pressure at home if you are able. Please write down these numbers and bring them to your visits.  If you have any questions about medications, please call me [phone].  Luke  

## 2019-02-07 MED FILL — AMLODIPINE BESYLATE 10 MG T: 10 | 90 days supply | Qty: 90 | Fill #1

## 2019-02-07 MED FILL — LISINOPRIL 10 MG TABS: 10 | 30 days supply | Qty: 30 | Fill #1

## 2019-03-17 ENCOUNTER — Other Ambulatory Visit: Payer: Self-pay | Admitting: Nurse Practitioner

## 2019-03-17 DIAGNOSIS — I10 Essential (primary) hypertension: Secondary | ICD-10-CM

## 2019-03-17 MED FILL — LISINOPRIL 10 MG TABS: 10 | 30 days supply | Qty: 30 | Fill #0

## 2019-04-18 ENCOUNTER — Ambulatory Visit: Payer: Self-pay | Attending: Nurse Practitioner | Admitting: Nurse Practitioner

## 2019-04-18 ENCOUNTER — Encounter: Payer: Self-pay | Admitting: Nurse Practitioner

## 2019-04-18 ENCOUNTER — Other Ambulatory Visit: Payer: Self-pay

## 2019-04-18 DIAGNOSIS — I10 Essential (primary) hypertension: Secondary | ICD-10-CM

## 2019-04-18 MED ORDER — AMLODIPINE BESYLATE 10 MG PO TABS: 10.0000 mg | ORAL_TABLET | Freq: Every day | ORAL | 1 refills | Status: DC

## 2019-04-18 MED ORDER — LISINOPRIL 10 MG PO TABS: 10.0000 mg | ORAL_TABLET | Freq: Every day | ORAL | 2 refills | Status: DC

## 2019-04-18 MED FILL — LISINOPRIL 10 MG TABS: 10 | 30 days supply | Qty: 30 | Fill #1

## 2019-04-18 NOTE — Progress Notes (Signed)
Virtual Visit via Telephone Note Due to national recommendations of social distancing due to [place] 19, telehealth visit is felt to be most appropriate for this patient at this time.  I discussed the limitations, risks, security and privacy concerns of performing an evaluation and management service by telephone and the availability of in person appointments. I also discussed with the patient that there may be a patient responsible charge related to this service. The patient expressed understanding and agreed to proceed.    I connected with Alexandra Jennings on 04/18/19  at   3:30 PM EST  EDT by telephone and verified that I am speaking with the correct person using two identifiers.   Consent I discussed the limitations, risks, security and privacy concerns of performing an evaluation and management service by telephone and the availability of in person appointments. I also discussed with the patient that there may be a patient responsible charge related to this service. The patient expressed understanding and agreed to proceed.   Location of Patient: Private Residence   Location of Provider: [place] and [place] participating in Telemedicine visit: Geryl Rankins FNP-BC [place] Interpreter ID # [id number]   History of Present Illness: Telemedicine visit for: Follow up  has a past medical history of Hypertension.   Essential Hypertension Monitoring her blood pressure at home with most recent reading during tele health visit : 124/76. Denies chest pain, shortness of breath, palpitations, lightheadedness, dizziness, headaches or BLE edema. Taking amlodipine 10 mg and lisinopril 10 mg daily as prescribed.  BP Readings from Last 3 Encounters:  01/26/19 134/85  01/14/19 (!) 147/84  12/07/18 (!) 160/80      Past Medical History:  Diagnosis Date  . Hypertension     Past Surgical History:  Procedure Laterality Date   . COLONOSCOPY N/A 01/30/2015   Procedure: COLONOSCOPY;  Surgeon: Danie Binder, MD;  [facility];  Service: Endoscopy;  Laterality: N/A;  1300   . TUBAL LIGATION      Family History  Problem Relation Age of Onset  . Cancer Mother        uterine  . Cancer Father        prostate    Social History   Socioeconomic History  . Marital status: Divorced    Spouse name: Not on file  . Number of children: Not on file  . Years of education: Not on file  . Highest education level: 8th grade  Occupational History  . Not on file  Tobacco Use  . Smoking status: Never Smoker  . Smokeless tobacco: Never Used  Substance and Sexual Activity  . Alcohol use: Never  . Drug use: Never  . Sexual activity: Not Currently    Birth control/protection: Surgical  Other Topics Concern  . Not on file  Social History Narrative   ** Merged History Encounter **       Social Determinants of Health   Financial Resource Strain:   . Difficulty of Paying Living Expenses: Not on file  Food Insecurity:   . Worried About Charity fundraiser in the Last Year: Not on file  . Ran Out of Food in the Last Year: Not on file  Transportation Needs: No Transportation Needs  . Lack of Transportation (Medical): No  . Lack of Transportation (Non-Medical): No  Physical Activity:   . Days of Exercise per Week: Not on file  . Minutes of Exercise  per Session: Not on file  Stress:   . Feeling of Stress : Not on file  Social Connections:   . Frequency of Communication with Friends and Family: Not on file  . Frequency of Social Gatherings with Friends and Family: Not on file  . Attends Religious Services: Not on file  . Active Member of Clubs or Organizations: Not on file  . Attends Archivist Meetings: Not on file  . Marital Status: Not on file     Observations/Objective: Awake, alert and oriented x 3   Review of Systems  Constitutional: Negative for fever, malaise/fatigue and weight  loss.  HENT: Negative.  Negative for nosebleeds.   Eyes: Negative.  Negative for blurred vision, double vision and photophobia.  Respiratory: Negative.  Negative for cough and shortness of breath.   Cardiovascular: Negative.  Negative for chest pain, palpitations and leg swelling.  Gastrointestinal: Negative.  Negative for heartburn, nausea and vomiting.  Musculoskeletal: Negative.  Negative for myalgias.  Neurological: Negative.  Negative for dizziness, focal weakness, seizures and headaches.  Psychiatric/Behavioral: Negative.  Negative for suicidal ideas.    Assessment and Plan: [name] was seen today for follow-up.  Diagnoses and all orders for this visit:  Essential hypertension -     amLODipine (NORVASC) 10 MG tablet; Take 1 tablet (10 mg total) by mouth daily. -     lisinopril (ZESTRIL) 10 MG tablet; Take 1 tablet (10 mg total) by mouth daily. She is taking omega 3 (2 capsules BID) 800 mg each   Follow Up Instructions Return in about 3 months (around 07/16/2019).     I discussed the assessment and treatment plan with the patient. The patient was provided an opportunity to ask questions and all were answered. The patient agreed with the plan and demonstrated an understanding of the instructions.   The patient was advised to call back or seek an in-person evaluation if the symptoms worsen or if the condition fails to improve as anticipated.  I provided 14 minutes of non-face-to-face time during this encounter including median intraservice time, reviewing previous notes, labs, imaging, medications and explaining diagnosis and management.  [name], FNP-BC

## 2019-05-07 ENCOUNTER — Ambulatory Visit: Payer: Self-pay | Attending: Internal Medicine

## 2019-05-07 DIAGNOSIS — Z23 Encounter for immunization: Secondary | ICD-10-CM | POA: Insufficient documentation

## 2019-05-07 NOTE — Progress Notes (Signed)
   Covid-19 Vaccination Clinic  Name:  Coralee Fedewa    MRN: [id number] DOB: 1960/07/05  05/07/2019  Ms. Sherrie Sport was observed post Covid-19 immunization for 15 minutes without incident. She was provided with Vaccine Information Sheet and instruction to access the V-Safe system.   Ms. Sherrie Sport was instructed to call 911 with any severe reactions post vaccine: Marland Kitchen Difficulty breathing  . Swelling of face and throat  . A fast heartbeat  . A bad rash all over body  . Dizziness and weakness   Immunizations Administered    Name Date Dose VIS Date Route   Pfizer COVID-19 Vaccine 05/07/2019  9:11 AM 0.3 mL 02/11/2019 Intramuscular   Manufacturer: [place]   Lot: [id number]   [place]: [id number]

## 2019-05-10 MED FILL — AMLODIPINE BESYLATE 10 MG T: 10 | 90 days supply | Qty: 90 | Fill #2

## 2019-05-13 MED FILL — LISINOPRIL 10 MG TABS: 10 | 30 days supply | Qty: 30 | Fill #2

## 2019-06-07 ENCOUNTER — Ambulatory Visit: Payer: No Typology Code available for payment source | Attending: Internal Medicine

## 2019-06-07 DIAGNOSIS — Z23 Encounter for immunization: Secondary | ICD-10-CM

## 2019-06-07 NOTE — Progress Notes (Signed)
   Covid-19 Vaccination Clinic  Name:  Alexandra Jennings    MRN: [id number] DOB: [birth year]  06/07/2019  [name] was observed post Covid-19 immunization for 15 minutes without incident. She was provided with Vaccine Information Sheet and instruction to access the V-Safe system.   [name] was instructed to call 911 with any severe reactions post vaccine: Marland Kitchen Difficulty breathing  . Swelling of face and throat  . A fast heartbeat  . A bad rash all over body  . Dizziness and weakness   Immunizations Administered    Name Date Dose VIS Date Route   Pfizer COVID-19 Vaccine 06/07/2019  8:54 AM 0.3 mL 02/11/2019 Intramuscular   Manufacturer: [place]   Lot: [id number]   [place]: [id number]

## 2019-06-27 MED FILL — LISINOPRIL 10 MG TABS: 10 | 30 days supply | Qty: 30 | Fill #0

## 2019-07-22 ENCOUNTER — Other Ambulatory Visit: Payer: Self-pay | Admitting: Nurse Practitioner

## 2019-07-22 ENCOUNTER — Encounter: Payer: Self-pay | Admitting: Nurse Practitioner

## 2019-07-22 ENCOUNTER — Other Ambulatory Visit: Payer: Self-pay

## 2019-07-22 ENCOUNTER — Ambulatory Visit: Payer: Self-pay | Attending: Nurse Practitioner | Admitting: Nurse Practitioner

## 2019-07-22 VITALS — BP 151/101 | HR 93 | Ht 62.0 in | Wt 169.2 lb

## 2019-07-22 DIAGNOSIS — Z130 Encounter for screening for diseases of the blood and blood-forming organs and certain disorders involving the immune mechanism: Secondary | ICD-10-CM

## 2019-07-22 DIAGNOSIS — I10 Essential (primary) hypertension: Secondary | ICD-10-CM

## 2019-07-22 DIAGNOSIS — Z13220 Encounter for screening for lipoid disorders: Secondary | ICD-10-CM

## 2019-07-22 DIAGNOSIS — R7303 Prediabetes: Secondary | ICD-10-CM

## 2019-07-22 MED ORDER — LISINOPRIL 10 MG PO TABS: 10.0000 mg | ORAL_TABLET | Freq: Every day | ORAL | 2 refills | Status: DC

## 2019-07-22 MED ORDER — AMLODIPINE BESYLATE 10 MG PO TABS: 10.0000 mg | ORAL_TABLET | Freq: Every day | ORAL | 1 refills | Status: DC

## 2019-07-22 MED FILL — ?AMLODIPINE BESYL 10MG TABL: 10 | 30 days supply | Qty: 30 | Fill #0

## 2019-07-22 MED FILL — LISINOPRIL 10 MG TABS: 10 | 30 days supply | Qty: 30 | Fill #1

## 2019-07-22 NOTE — Progress Notes (Signed)
Needs refills on medications. 

## 2019-07-22 NOTE — Progress Notes (Signed)
Assessment & Plan:  Ilo was seen today for hypertension.  Diagnoses and all orders for this visit:  Essential hypertension -     amLODipine (NORVASC) 10 MG tablet; Take 1 tablet (10 mg total) by mouth daily. -     lisinopril (ZESTRIL) 10 MG tablet; Take 1 tablet (10 mg total) by mouth daily. -     CMP14+EGFR Continue all antihypertensives as prescribed.  Remember to bring in your blood pressure log with you for your follow up appointment.  DASH/Mediterranean Diets are healthier choices for HTN.    Prediabetes -     Hemoglobin A1c Lab Results  Component Value Date   HGBA1C 5.5 01/14/2019    Screening for deficiency anemia -     CBC  Lipid screening -     Lipid panel    Patient has been counseled on age-appropriate routine health concerns for screening and prevention. These are reviewed and up-to-date. Referrals have been placed accordingly. Immunizations are up-to-date or declined.    Subjective:   Chief Complaint  Patient presents with  . Hypertension   HPI Alexandra Jennings 59 y.o. female presents to office today for follow up.   Essential Hypertension She did not take her blood pressure medication today. Denies chest pain, shortness of breath, palpitations, lightheadedness, dizziness, headaches or BLE edema. Current medications include: Amlodipine 10 mg daily and lisinopril 10 mg daily. Blood pressure readings at home WNL. Last home reading 124/76 BP Readings from Last 3 Encounters:  07/22/19 (!) 151/101  01/26/19 134/85  01/14/19 (!) 147/84    Review of Systems  Constitutional: Negative for fever, malaise/fatigue and weight loss.  HENT: Negative.  Negative for nosebleeds.   Eyes: Negative.  Negative for blurred vision, double vision and photophobia.  Respiratory: Negative.  Negative for cough and shortness of breath.   Cardiovascular: Negative.  Negative for chest pain, palpitations and leg swelling.  Gastrointestinal: Negative.  Negative for  heartburn, nausea and vomiting.  Musculoskeletal: Negative.  Negative for myalgias.  Neurological: Negative.  Negative for dizziness, focal weakness, seizures and headaches.  Psychiatric/Behavioral: Negative.  Negative for suicidal ideas.    Past Medical History:  Diagnosis Date  . Hypertension     Past Surgical History:  Procedure Laterality Date  . COLONOSCOPY N/A 01/30/2015   Procedure: COLONOSCOPY;  Surgeon: Danie Binder, MD;  [facility];  Service: Endoscopy;  Laterality: N/A;  1300   . TUBAL LIGATION      Family History  Problem Relation Age of Onset  . Cancer Mother        uterine  . Cancer Father        prostate    Social History Reviewed with no changes to be made today.   Outpatient Medications Prior to Visit  Medication Sig Dispense Refill  . amLODipine (NORVASC) 10 MG tablet Take 1 tablet (10 mg total) by mouth daily. 90 tablet 1  . lisinopril (ZESTRIL) 10 MG tablet Take 1 tablet (10 mg total) by mouth daily. 30 tablet 2   No facility-administered medications prior to visit.    No Known Allergies     Objective:    BP (!) 151/101   Pulse 93   Ht '5\' 2"'  (1.575 m)   Wt 169 lb 3.2 oz (76.7 kg)   LMP 04/05/2011   SpO2 100%   BMI 30.95 kg/m  Wt Readings from Last 3 Encounters:  07/22/19 169 lb 3.2 oz (76.7 kg)  01/14/19 168 lb (76.2 kg)  12/07/18 168 lb (76.2 kg)    Physical Exam Vitals and nursing note reviewed.  Constitutional:      Appearance: She is well-developed.  HENT:     Head: Normocephalic and atraumatic.  Cardiovascular:     Rate and Rhythm: Normal rate and regular rhythm.     Heart sounds: Normal heart sounds. No murmur. No friction rub. No gallop.   Pulmonary:     Effort: Pulmonary effort is normal. No tachypnea or respiratory distress.     Breath sounds: Normal breath sounds. No decreased breath sounds, wheezing, rhonchi or rales.  Chest:     Chest wall: No tenderness.  Abdominal:     General: Bowel sounds are  normal.     Palpations: Abdomen is soft.  Musculoskeletal:        General: Normal range of motion.     Cervical back: Normal range of motion.  Skin:    General: Skin is warm and dry.  Neurological:     Mental Status: She is alert and oriented to person, place, and time.     Coordination: Coordination normal.  Psychiatric:        Behavior: Behavior normal. Behavior is cooperative.        Thought Content: Thought content normal.        Judgment: Judgment normal.          Patient has been counseled extensively about nutrition and exercise as well as the importance of adherence with medications and regular follow-up. The patient was given clear instructions to go to ER or return to medical center if symptoms don't improve, worsen or new problems develop. The patient verbalized understanding.   Follow-up: Return in about 3 months (around 10/22/2019).   [name], FNP-BC [facility] and [place], [place]   07/22/2019, 3:23 PM

## 2019-07-23 LAB — CBC
Hematocrit: 39.7 % (ref 34.0–46.6)
Hemoglobin: 13.7 g/dL (ref 11.1–15.9)
MCH: 32 pg (ref 26.6–33.0)
MCHC: 34.5 g/dL (ref 31.5–35.7)
MCV: 93 fL (ref 79–97)
Platelets: 234 10*3/uL (ref 150–450)
RBC: 4.28 x10E6/uL (ref 3.77–5.28)
RDW: 12.9 % (ref 11.7–15.4)
WBC: 8.1 10*3/uL (ref 3.4–10.8)

## 2019-07-23 LAB — LIPID PANEL
Chol/HDL Ratio: 4.1 ratio (ref 0.0–4.4)
Cholesterol, Total: 207 mg/dL — ABNORMAL HIGH (ref 100–199)
HDL: 51 mg/dL (ref >39)
LDL Chol Calc (NIH): 139 mg/dL — ABNORMAL HIGH (ref 0–99)
Triglycerides: 96 mg/dL (ref 0–149)
VLDL Cholesterol Cal: 17 mg/dL (ref 5–40)

## 2019-07-23 LAB — HEMOGLOBIN A1C
Est. average glucose Bld gHb Est-mCnc: 114 mg/dL
Hgb A1c MFr Bld: 5.6 % (ref 4.8–5.6)

## 2019-07-23 LAB — CMP14+EGFR
ALT: 25 IU/L (ref 0–32)
AST: 23 IU/L (ref 0–40)
Albumin/Globulin Ratio: 1.8 (ref 1.2–2.2)
Albumin: 4.6 g/dL (ref 3.8–4.9)
Alkaline Phosphatase: 86 IU/L (ref 48–121)
BUN/Creatinine Ratio: 23 (ref 9–23)
BUN: 15 mg/dL (ref 6–24)
Bilirubin Total: 0.2 mg/dL (ref 0.0–1.2)
CO2: 24 mmol/L (ref 20–29)
Calcium: 9.7 mg/dL (ref 8.7–10.2)
Chloride: 102 mmol/L (ref 96–106)
Creatinine, Ser: 0.65 mg/dL (ref 0.57–1.00)
GFR calc Af Amer: 114 mL/min/{1.73_m2} (ref >59)
GFR calc non Af Amer: 99 mL/min/{1.73_m2} (ref >59)
Globulin, Total: 2.6 g/dL (ref 1.5–4.5)
Glucose: 82 mg/dL (ref 65–99)
Potassium: 4.5 mmol/L (ref 3.5–5.2)
Sodium: 138 mmol/L (ref 134–144)
Total Protein: 7.2 g/dL (ref 6.0–8.5)

## 2019-09-07 MED FILL — ?AMLODIPINE BESYL 10MG TABL: 10 | 30 days supply | Qty: 30 | Fill #1

## 2019-09-07 MED FILL — LISINOPRIL 10 MG TABS: 10 | 30 days supply | Qty: 30 | Fill #2

## 2019-10-12 MED FILL — LISINOPRIL 10 MG TABS: 10 | 30 days supply | Qty: 30 | Fill #0

## 2019-10-12 MED FILL — AMLODIPINE BESYLATE 10 MG T: 10 | 30 days supply | Qty: 30 | Fill #2

## 2019-10-24 ENCOUNTER — Ambulatory Visit: Payer: No Typology Code available for payment source | Admitting: Nurse Practitioner

## 2019-10-24 ENCOUNTER — Other Ambulatory Visit: Payer: Self-pay

## 2019-10-24 ENCOUNTER — Ambulatory Visit: Payer: No Typology Code available for payment source

## 2019-11-23 MED FILL — LISINOPRIL 10 MG TABS: 10 | 30 days supply | Qty: 30 | Fill #1

## 2019-11-23 MED FILL — AMLODIPINE BESYLATE 10 MG T: 10 | 30 days supply | Qty: 30 | Fill #3

## 2019-12-02 ENCOUNTER — Ambulatory Visit: Payer: Self-pay | Attending: Nurse Practitioner | Admitting: Nurse Practitioner

## 2019-12-02 ENCOUNTER — Other Ambulatory Visit: Payer: Self-pay

## 2019-12-02 ENCOUNTER — Other Ambulatory Visit: Payer: Self-pay | Admitting: Nurse Practitioner

## 2019-12-02 ENCOUNTER — Encounter: Payer: Self-pay | Admitting: Nurse Practitioner

## 2019-12-02 DIAGNOSIS — I10 Essential (primary) hypertension: Secondary | ICD-10-CM

## 2019-12-02 MED ORDER — LISINOPRIL 10 MG PO TABS: 10.0000 mg | ORAL_TABLET | Freq: Every day | ORAL | 1 refills | Status: DC

## 2019-12-02 MED ORDER — AMLODIPINE BESYLATE 10 MG PO TABS: 10.0000 mg | ORAL_TABLET | Freq: Every day | ORAL | 1 refills | Status: DC

## 2019-12-02 NOTE — Progress Notes (Signed)
   Assessment & Plan:  Alexandra Jennings was seen today for hypertension.  Diagnoses and all orders for this visit:  Essential hypertension -     Basic metabolic panel -     amLODipine (NORVASC) 10 MG tablet; Take 1 tablet (10 mg total) by mouth daily. -     lisinopril (ZESTRIL) 10 MG tablet; Take 1 tablet (10 mg total) by mouth daily. Continue all antihypertensives as prescribed.  Remember to bring in your blood pressure log with you for your follow up appointment.  DASH/Mediterranean Diets are healthier choices for HTN.    Patient has been counseled on age-appropriate routine health concerns for screening and prevention. These are reviewed and up-to-date. Referrals have been placed accordingly. Immunizations are up-to-date or declined.    Subjective:   Chief Complaint  Patient presents with  . Hypertension   HPI Alexandra Jennings 59 y.o. female presents to office today for f/u HTN  Essential Hypertension Blood pressure is well controlled. Denies chest pain, shortness of breath, palpitations, lightheadedness, dizziness, headaches or BLE edema.   Currently taking amlodipine 10 mg daily and lisinopril 10 mg daily as prescribed.  BP Readings from Last 3 Encounters:  12/02/19 138/78  07/22/19 (!) 151/101  01/26/19 134/85    ROS  Past Medical History:  Diagnosis Date  . Hypertension     Past Surgical History:  Procedure Laterality Date  . COLONOSCOPY N/A 01/30/2015   Procedure: COLONOSCOPY;  Surgeon: Danie Binder, MD;  [facility];  Service: Endoscopy;  Laterality: N/A;  1300   . TUBAL LIGATION      Family History  Problem Relation Age of Onset  . Cancer Mother        uterine  . Cancer Father        prostate    Social History Reviewed with no changes to be made today.   Outpatient Medications Prior to Visit  Medication Sig Dispense Refill  . amLODipine (NORVASC) 10 MG tablet Take 1 tablet (10 mg total) by mouth daily. 90 tablet 1  . lisinopril  (ZESTRIL) 10 MG tablet Take 1 tablet (10 mg total) by mouth daily. 30 tablet 2   No facility-administered medications prior to visit.    No Known Allergies     Objective:    BP 138/78   Pulse 91   Resp 16   Wt 172 lb 6.4 oz (78.2 kg)   LMP 04/05/2011   SpO2 100%   BMI 31.53 kg/m  Wt Readings from Last 3 Encounters:  12/02/19 172 lb 6.4 oz (78.2 kg)  07/22/19 169 lb 3.2 oz (76.7 kg)  01/14/19 168 lb (76.2 kg)    Physical Exam       Patient has been counseled extensively about nutrition and exercise as well as the importance of adherence with medications and regular follow-up. The patient was given clear instructions to go to ER or return to medical center if symptoms don't improve, worsen or new problems develop. The patient verbalized understanding.   Follow-up: Return in about 3 months (around 03/03/2020) for HTN.   [name], FNP-BC [facility] and [place], [place]   12/02/2019, 2:09 PM

## 2019-12-03 LAB — BASIC METABOLIC PANEL
BUN/Creatinine Ratio: 25 — ABNORMAL HIGH (ref 9–23)
BUN: 13 mg/dL (ref 6–24)
CO2: 23 mmol/L (ref 20–29)
Calcium: 9.5 mg/dL (ref 8.7–10.2)
Chloride: 104 mmol/L (ref 96–106)
Creatinine, Ser: 0.53 mg/dL — ABNORMAL LOW (ref 0.57–1.00)
GFR calc Af Amer: 122 mL/min/{1.73_m2} (ref >59)
GFR calc non Af Amer: 106 mL/min/{1.73_m2} (ref >59)
Glucose: 83 mg/dL (ref 65–99)
Potassium: 4.1 mmol/L (ref 3.5–5.2)
Sodium: 140 mmol/L (ref 134–144)

## 2019-12-07 ENCOUNTER — Other Ambulatory Visit: Payer: Self-pay

## 2019-12-07 DIAGNOSIS — Z1231 Encounter for screening mammogram for malignant neoplasm of breast: Secondary | ICD-10-CM

## 2019-12-23 MED FILL — AMLODIPINE BESYLATE 10 MG T: 10 | 30 days supply | Qty: 30 | Fill #4

## 2019-12-23 MED FILL — LISINOPRIL 10 MG TABS: 10 | 30 days supply | Qty: 30 | Fill #2

## 2020-01-19 ENCOUNTER — Ambulatory Visit: Payer: Self-pay

## 2020-02-02 ENCOUNTER — Other Ambulatory Visit: Payer: Self-pay

## 2020-02-02 ENCOUNTER — Ambulatory Visit
Admission: RE | Admit: 2020-02-02 | Discharge: 2020-02-02 | Disposition: A | Discharge status: Home / Self Care | Payer: No Typology Code available for payment source | Source: Ambulatory Visit | Attending: Nurse Practitioner | Admitting: Nurse Practitioner

## 2020-02-02 ENCOUNTER — Ambulatory Visit: Payer: No Typology Code available for payment source | Admitting: *Deleted

## 2020-02-02 VITALS — BP 150/90 | Wt 178.7 lb

## 2020-02-02 DIAGNOSIS — Z1239 Encounter for other screening for malignant neoplasm of breast: Secondary | ICD-10-CM

## 2020-02-02 DIAGNOSIS — Z1231 Encounter for screening mammogram for malignant neoplasm of breast: Secondary | ICD-10-CM

## 2020-02-02 NOTE — Progress Notes (Signed)
[name] is a 59 y.o. female who presents to [facility] clinic today with no complaints.    Pap Smear: Pap smear not completed today. Last Pap smear was 7/29/2019at [facility] normal with negative HPV. Per patient has no history of an abnormal Pap smear. Last Pap smear result is available in Epic.   Physical exam: Breasts Breasts symmetrical. No skin abnormalities bilateral breasts. No nipple retraction bilateral breasts. No nipple discharge bilateral breasts. No lymphadenopathy. No lumps palpated bilateral breasts. No complaints of pain or tenderness on exam.       Pelvic/Bimanual Pap is not indicated today per BCCCP guidelines.   Smoking History: Patient has never smoked.   Patient Navigation: Patient education provided. Access to services provided for patient through [place] program. Spanish interpreter Alexandra Jennings from [facility] provided.   Colorectal Cancer Screening: Patient has had colonoscopy completed on 01/30/2015. Patient completed a FIT Test 10/26/2017 that was negative. No complaints today.    Breast and Cervical Cancer Risk Assessment: Patient does not have family history of breast cancer, known genetic mutations, or radiation treatment to the chest before age 85. Patient does not have history of cervical dysplasia, immunocompromised, or DES exposure in-utero.  Risk Assessment    Risk Scores      02/02/2020 12/07/2018   Last edited by: Demetrius Revel, LPN Malakai Schoenherr, Heath Gold, RN   5-year risk: 0.6 % 0.6 %   Lifetime risk: 4 % 4.1 %         A: BCCCP exam without pap smear No complaints.  P: Referred patient to the [facility] for a screening mammogram on the mobile unit. Appointment scheduled Thursday, February 02, 2020 at 1550.  Loletta Parish, RN 02/02/2020 3:29 PM

## 2020-02-02 NOTE — Patient Instructions (Addendum)
Explained breast self awareness with [name]. Patient did not need a Pap smear today due to last Pap smear was 09/28/2017. Let her know BCCCP will cover Pap smears every 3 years unless has a history of abnormal Pap smears. Referred patient to the [facility] for a screening mammogram on the mobile unit. Appointment scheduled Thursday, February 02, 2020 at 1550. Patient escorted to the mobile unit following BCCCP appointment for her screening mammogram. Let patient know the [facility] will follow up with her within the next couple weeks with results of her mammogram by letter or phone. [place] verbalized understanding.  Kiren Mcisaac, Arvil Chaco, RN 3:29 PM

## 2020-02-07 MED FILL — LISINOPRIL 10 MG TABS: 10 | 30 days supply | Qty: 30 | Fill #0

## 2020-02-07 MED FILL — AMLODIPINE BESYLATE 10 MG T: 10 | 30 days supply | Qty: 30 | Fill #5

## 2020-03-13 MED FILL — LISINOPRIL 10 MG TABS: 10 | 30 days supply | Qty: 30 | Fill #1

## 2020-03-13 MED FILL — AMLODIPINE BESYLATE 10 MG T: 10 | 30 days supply | Qty: 30 | Fill #0

## 2020-04-20 MED FILL — AMLODIPINE BESYLATE 10 MG T: 10 | 30 days supply | Qty: 30 | Fill #1

## 2020-04-20 MED FILL — LISINOPRIL 10 MG TABS: 10 | 30 days supply | Qty: 30 | Fill #2

## 2020-04-23 ENCOUNTER — Other Ambulatory Visit: Payer: Self-pay | Admitting: Nurse Practitioner

## 2020-04-23 ENCOUNTER — Ambulatory Visit: Payer: Self-pay | Attending: Nurse Practitioner | Admitting: Nurse Practitioner

## 2020-04-23 ENCOUNTER — Other Ambulatory Visit: Payer: Self-pay

## 2020-04-23 ENCOUNTER — Encounter: Payer: Self-pay | Admitting: Nurse Practitioner

## 2020-04-23 VITALS — BP 136/82 | HR 84 | Temp 97.6°F | Ht 62.0 in | Wt 178.0 lb

## 2020-04-23 DIAGNOSIS — Z114 Encounter for screening for human immunodeficiency virus [HIV]: Secondary | ICD-10-CM

## 2020-04-23 DIAGNOSIS — I10 Essential (primary) hypertension: Secondary | ICD-10-CM

## 2020-04-23 DIAGNOSIS — R7303 Prediabetes: Secondary | ICD-10-CM

## 2020-04-23 DIAGNOSIS — Z130 Encounter for screening for diseases of the blood and blood-forming organs and certain disorders involving the immune mechanism: Secondary | ICD-10-CM

## 2020-04-23 DIAGNOSIS — Z1211 Encounter for screening for malignant neoplasm of colon: Secondary | ICD-10-CM

## 2020-04-23 DIAGNOSIS — E785 Hyperlipidemia, unspecified: Secondary | ICD-10-CM

## 2020-04-23 LAB — POCT GLYCOSYLATED HEMOGLOBIN (HGB A1C): Hemoglobin A1C: 5.3 % (ref 4.0–5.6)

## 2020-04-23 LAB — GLUCOSE, POCT (MANUAL RESULT ENTRY): POC Glucose: 97 mg/dl (ref 70–99)

## 2020-04-23 MED ORDER — AMLODIPINE BESYLATE 10 MG PO TABS: 10.0000 mg | ORAL_TABLET | Freq: Every day | ORAL | 1 refills | Status: DC

## 2020-04-23 MED ORDER — LISINOPRIL 10 MG PO TABS: 10.0000 mg | ORAL_TABLET | Freq: Every day | ORAL | 1 refills | Status: DC

## 2020-04-23 NOTE — Progress Notes (Signed)
Assessment & Plan:  [place] was seen today for hypertension.  Diagnoses and all orders for this visit:  Essential hypertension -     amLODipine (NORVASC) 10 MG tablet; Take 1 tablet (10 mg total) by mouth daily. -     lisinopril (ZESTRIL) 10 MG tablet; Take 1 tablet (10 mg total) by mouth daily. Continue all antihypertensives as prescribed.  Remember to bring in your blood pressure log with you for your follow up appointment.  DASH/Mediterranean Diets are healthier choices for HTN.    Prediabetes -     Glucose (CBG) -     HgB A1c  Encounter for screening for HIV -     HIV antibody (with reflex)    Patient has been counseled on age-appropriate routine health concerns for screening and prevention. These are reviewed and up-to-date. Referrals have been placed accordingly. Immunizations are up-to-date or declined.    Subjective:   Chief Complaint  Patient presents with  . Hypertension    Patient is here for hypertension follow up.    HPI Alexandra Jennings 60 y.o. female presents to office today for follow up. She has a past medical history of Hypertension and prediabetes  VRI was used to communicate directly with patient for the entire encounter including providing detailed patient instructions.    Essential Hypertension Taking amlodipine 10 mg daily and lisinopril 10 mg daily as prescribed. Denies chest pain, shortness of breath, palpitations, lightheadedness, dizziness, headaches or BLE edema.  BP Readings from Last 3 Encounters:  04/23/20 136/82  02/02/20 (!) 150/90  12/02/19 138/78    Prediabetes Well controlled without the use of oral diabetic agents.  Lab Results  Component Value Date   HGBA1C 5.3 04/23/2020   Lab Results  Component Value Date   HGBA1C 5.6 07/22/2019    Review of Systems  Constitutional: Negative for fever, malaise/fatigue and weight loss.  HENT: Negative.  Negative for nosebleeds.   Eyes: Negative.  Negative for blurred vision,  double vision and photophobia.  Respiratory: Negative.  Negative for cough and shortness of breath.   Cardiovascular: Negative.  Negative for chest pain, palpitations and leg swelling.  Gastrointestinal: Negative.  Negative for heartburn, nausea and vomiting.  Musculoskeletal: Negative.  Negative for myalgias.  Neurological: Negative.  Negative for dizziness, focal weakness, seizures and headaches.  Psychiatric/Behavioral: Negative.  Negative for suicidal ideas.    Past Medical History:  Diagnosis Date  . Hypertension     Past Surgical History:  Procedure Laterality Date  . COLONOSCOPY N/A 01/30/2015   Procedure: COLONOSCOPY;  Surgeon: Danie Binder, MD;  [facility];  Service: Endoscopy;  Laterality: N/A;  1300   . TUBAL LIGATION      Family History  Problem Relation Age of Onset  . Cancer Mother        uterine  . Cancer Father        prostate    Social History Reviewed with no changes to be made today.   Outpatient Medications Prior to Visit  Medication Sig Dispense Refill  . amLODipine (NORVASC) 10 MG tablet Take 1 tablet (10 mg total) by mouth daily. 90 tablet 1  . lisinopril (ZESTRIL) 10 MG tablet Take 1 tablet (10 mg total) by mouth daily. 90 tablet 1   No facility-administered medications prior to visit.    No Known Allergies     Objective:    BP 136/82 (BP Location: Left Arm, Patient Position: Sitting, Cuff Size: Large)   Pulse 84  Temp 97.6 F (36.4 C) (Oral)   Ht 5\' 2"  (1.575 m)   Wt 178 lb (80.7 kg)   LMP 04/05/2011   SpO2 100%   BMI 32.56 kg/m  Wt Readings from Last 3 Encounters:  04/23/20 178 lb (80.7 kg)  02/02/20 178 lb 11.2 oz (81.1 kg)  12/02/19 172 lb 6.4 oz (78.2 kg)    Physical Exam Vitals and nursing note reviewed.  Constitutional:      Appearance: She is well-developed and well-nourished.  HENT:     Head: Normocephalic and atraumatic.  Eyes:     Extraocular Movements: EOM normal.  Cardiovascular:     Rate and  Rhythm: Normal rate and regular rhythm.     Pulses: Intact distal pulses.     Heart sounds: Normal heart sounds. No murmur heard. No friction rub. No gallop.   Pulmonary:     Effort: Pulmonary effort is normal. No tachypnea or respiratory distress.     Breath sounds: Normal breath sounds. No decreased breath sounds, wheezing, rhonchi or rales.  Chest:     Chest wall: No tenderness.  Abdominal:     General: Bowel sounds are normal.     Palpations: Abdomen is soft.  Musculoskeletal:        General: No edema. Normal range of motion.     Cervical back: Normal range of motion.  Skin:    General: Skin is warm and dry.  Neurological:     Mental Status: She is alert and oriented to person, place, and time.     Coordination: Coordination normal.  Psychiatric:        Mood and Affect: Mood and affect normal.        Behavior: Behavior normal. Behavior is cooperative.        Thought Content: Thought content normal.        Judgment: Judgment normal.          Patient has been counseled extensively about nutrition and exercise as well as the importance of adherence with medications and regular follow-up. The patient was given clear instructions to go to ER or return to medical center if symptoms don't improve, worsen or new problems develop. The patient verbalized understanding.   Follow-up: Return in about 3 months (around 07/21/2020).   Alexandra Pounds, FNP-BC [facility] and [place], [place]   04/23/2020, 2:46 PM

## 2020-04-24 LAB — CMP14+EGFR
ALT: 41 IU/L — ABNORMAL HIGH (ref 0–32)
AST: 25 IU/L (ref 0–40)
Albumin/Globulin Ratio: 1.5 (ref 1.2–2.2)
Albumin: 4.4 g/dL (ref 3.8–4.9)
Alkaline Phosphatase: 93 IU/L (ref 44–121)
BUN/Creatinine Ratio: 27 — ABNORMAL HIGH (ref 9–23)
BUN: 15 mg/dL (ref 6–24)
Bilirubin Total: <0.2 mg/dL (ref 0.0–1.2)
CO2: 22 mmol/L (ref 20–29)
Calcium: 9.4 mg/dL (ref 8.7–10.2)
Chloride: 102 mmol/L (ref 96–106)
Creatinine, Ser: 0.55 mg/dL — ABNORMAL LOW (ref 0.57–1.00)
GFR calc Af Amer: 120 mL/min/{1.73_m2} (ref >59)
GFR calc non Af Amer: 104 mL/min/{1.73_m2} (ref >59)
Globulin, Total: 2.9 g/dL (ref 1.5–4.5)
Glucose: 84 mg/dL (ref 65–99)
Potassium: 4.2 mmol/L (ref 3.5–5.2)
Sodium: 138 mmol/L (ref 134–144)
Total Protein: 7.3 g/dL (ref 6.0–8.5)

## 2020-04-24 LAB — LIPID PANEL
Chol/HDL Ratio: 5.1 ratio — ABNORMAL HIGH (ref 0.0–4.4)
Cholesterol, Total: 202 mg/dL — ABNORMAL HIGH (ref 100–199)
HDL: 40 mg/dL (ref >39)
LDL Chol Calc (NIH): 120 mg/dL — ABNORMAL HIGH (ref 0–99)
Triglycerides: 236 mg/dL — ABNORMAL HIGH (ref 0–149)
VLDL Cholesterol Cal: 42 mg/dL — ABNORMAL HIGH (ref 5–40)

## 2020-04-24 LAB — CBC
Hematocrit: 41.3 % (ref 34.0–46.6)
Hemoglobin: 13.8 g/dL (ref 11.1–15.9)
MCH: 31.4 pg (ref 26.6–33.0)
MCHC: 33.4 g/dL (ref 31.5–35.7)
MCV: 94 fL (ref 79–97)
Platelets: 236 10*3/uL (ref 150–450)
RBC: 4.39 x10E6/uL (ref 3.77–5.28)
RDW: 13 % (ref 11.7–15.4)
WBC: 7.1 10*3/uL (ref 3.4–10.8)

## 2020-04-24 LAB — HIV ANTIBODY (ROUTINE TESTING W REFLEX): HIV Screen 4th Generation wRfx: NONREACTIVE

## 2020-05-03 ENCOUNTER — Ambulatory Visit: Payer: Self-pay | Attending: Nurse Practitioner

## 2020-05-03 ENCOUNTER — Other Ambulatory Visit: Payer: Self-pay

## 2020-05-28 MED FILL — AMLODIPINE BESYLATE 10 MG T: 10 | 30 days supply | Qty: 30 | Fill #0

## 2020-05-28 MED FILL — LISINOPRIL 10 MG TABS: 10 | 30 days supply | Qty: 30 | Fill #0

## 2020-06-02 ENCOUNTER — Other Ambulatory Visit: Payer: Self-pay

## 2020-07-12 ENCOUNTER — Other Ambulatory Visit: Payer: Self-pay

## 2020-07-12 MED FILL — Amlodipine Besylate Tab 10 MG (Base Equivalent): ORAL | 30 days supply | Qty: 30 | Fill #0 | Status: AC

## 2020-07-12 MED FILL — Lisinopril Tab 10 MG: ORAL | 30 days supply | Qty: 30 | Fill #0 | Status: AC

## 2020-07-31 ENCOUNTER — Encounter: Payer: Self-pay | Admitting: Nurse Practitioner

## 2020-07-31 ENCOUNTER — Other Ambulatory Visit: Payer: Self-pay

## 2020-07-31 ENCOUNTER — Ambulatory Visit: Payer: Self-pay | Attending: Nurse Practitioner | Admitting: Nurse Practitioner

## 2020-07-31 VITALS — BP 154/91 | HR 90 | Resp 16 | Wt 170.6 lb

## 2020-07-31 DIAGNOSIS — I10 Essential (primary) hypertension: Secondary | ICD-10-CM

## 2020-07-31 NOTE — Progress Notes (Signed)
Assessment & Plan:  Alexandra Jennings was seen today for hypertension.  Diagnoses and all orders for this visit:  Essential hypertension -     Basic metabolic panel Continue all antihypertensives as prescribed.  Remember to bring in your blood pressure log with you for your follow up appointment.  DASH/Mediterranean Diets are healthier choices for HTN.    Patient has been counseled on age-appropriate routine health concerns for screening and prevention. These are reviewed and up-to-date. Referrals have been placed accordingly. Immunizations are up-to-date or declined.    Subjective:   Chief Complaint  Patient presents with  . Hypertension   HPI Alexandra Jennings 60 y.o. female presents to office today for follow up. VRI was used to communicate directly with patient for the entire encounter including providing detailed patient instructions.   She has a past medical history of Hypertension. She does not have any issue or concerns today.   Essential Hypertension Blood pressure not well controlled today. She states she was rushing from work to get to her appt today and did not take her blood pressure medications. Denies chest pain, shortness of breath, palpitations, lightheadedness, dizziness, headaches or BLE edema.  Currently taking amlodipine 10 mg daily and lisinopril 10 mg daily as prescribed.  BP Readings from Last 3 Encounters:  07/31/20 (!) 154/91  04/23/20 136/82  02/02/20 (!) 150/90      Review of Systems  Constitutional: Negative for fever, malaise/fatigue and weight loss.  HENT: Negative.  Negative for nosebleeds.   Eyes: Negative.  Negative for blurred vision, double vision and photophobia.  Respiratory: Negative.  Negative for cough and shortness of breath.   Cardiovascular: Negative.  Negative for chest pain, palpitations and leg swelling.  Gastrointestinal: Negative.  Negative for heartburn, nausea and vomiting.  Musculoskeletal: Negative.  Negative for myalgias.   Neurological: Negative.  Negative for dizziness, focal weakness, seizures and headaches.  Psychiatric/Behavioral: Negative.  Negative for suicidal ideas.    Past Medical History:  Diagnosis Date  . Hypertension     Past Surgical History:  Procedure Laterality Date  . COLONOSCOPY N/A 01/30/2015   Procedure: COLONOSCOPY;  Surgeon: Danie Binder, MD;  [facility];  Service: Endoscopy;  Laterality: N/A;  1300   . TUBAL LIGATION      Family History  Problem Relation Age of Onset  . Cancer Mother        uterine  . Cancer Father        prostate    Social History Reviewed with no changes to be made today.   Outpatient Medications Prior to Visit  Medication Sig Dispense Refill  . amLODipine (NORVASC) 10 MG tablet TAKE 1 TABLET (10 MG TOTAL) BY MOUTH DAILY. 90 tablet 1  . lisinopril (ZESTRIL) 10 MG tablet TAKE 1 TABLET (10 MG TOTAL) BY MOUTH DAILY. 90 tablet 1   No facility-administered medications prior to visit.    No Known Allergies     Objective:    BP (!) 154/91   Pulse 90   Resp 16   Wt 170 lb 9.6 oz (77.4 kg)   LMP 04/05/2011   SpO2 99%   BMI 31.20 kg/m  Wt Readings from Last 3 Encounters:  07/31/20 170 lb 9.6 oz (77.4 kg)  04/23/20 178 lb (80.7 kg)  02/02/20 178 lb 11.2 oz (81.1 kg)    Physical Exam Vitals and nursing note reviewed.  Constitutional:      Appearance: She is well-developed.  HENT:     Head:  Normocephalic and atraumatic.  Cardiovascular:     Rate and Rhythm: Normal rate and regular rhythm.     Heart sounds: Normal heart sounds. No murmur heard. No friction rub. No gallop.   Pulmonary:     Effort: Pulmonary effort is normal. No tachypnea or respiratory distress.     Breath sounds: Normal breath sounds. No decreased breath sounds, wheezing, rhonchi or rales.  Chest:     Chest wall: No tenderness.  Abdominal:     General: Bowel sounds are normal.     Palpations: Abdomen is soft.  Musculoskeletal:        General: Normal  range of motion.     Cervical back: Normal range of motion.  Skin:    General: Skin is warm and dry.  Neurological:     Mental Status: She is alert and oriented to person, place, and time.     Coordination: Coordination normal.  Psychiatric:        Behavior: Behavior normal. Behavior is cooperative.        Thought Content: Thought content normal.        Judgment: Judgment normal.          Patient has been counseled extensively about nutrition and exercise as well as the importance of adherence with medications and regular follow-up. The patient was given clear instructions to go to ER or return to medical center if symptoms don't improve, worsen or new problems develop. The patient verbalized understanding.   Follow-up: Return in about 4 weeks (around 08/28/2020) for BP CHECK WITH LUKE see me end of august.   Alexandra Jennings W Katiana Ruland, FNP-BC [facility] and [facility] [place], [place]   07/31/2020, 10:04 PM

## 2020-08-01 LAB — BASIC METABOLIC PANEL
BUN/Creatinine Ratio: 21 (ref 9–23)
BUN: 13 mg/dL (ref 6–24)
CO2: 22 mmol/L (ref 20–29)
Calcium: 9.2 mg/dL (ref 8.7–10.2)
Chloride: 106 mmol/L (ref 96–106)
Creatinine, Ser: 0.61 mg/dL (ref 0.57–1.00)
Glucose: 100 mg/dL — ABNORMAL HIGH (ref 65–99)
Potassium: 3.8 mmol/L (ref 3.5–5.2)
Sodium: 142 mmol/L (ref 134–144)
eGFR: 104 mL/min/{1.73_m2} (ref >59)

## 2020-08-19 MED FILL — Lisinopril Tab 10 MG: ORAL | 30 days supply | Qty: 30 | Fill #1 | Status: AC

## 2020-08-19 MED FILL — Amlodipine Besylate Tab 10 MG (Base Equivalent): ORAL | 30 days supply | Qty: 30 | Fill #1 | Status: AC

## 2020-08-20 ENCOUNTER — Other Ambulatory Visit: Payer: Self-pay

## 2020-08-21 IMAGING — DX DG CHEST 2V
2 series · 2 of 2 positions shown · non-contrast
Comparison: 10/11/2013

CLINICAL DATA: Shortness of breath.  Chest pressure.

EXAM:
CHEST - 2 VIEW

[chest pa]
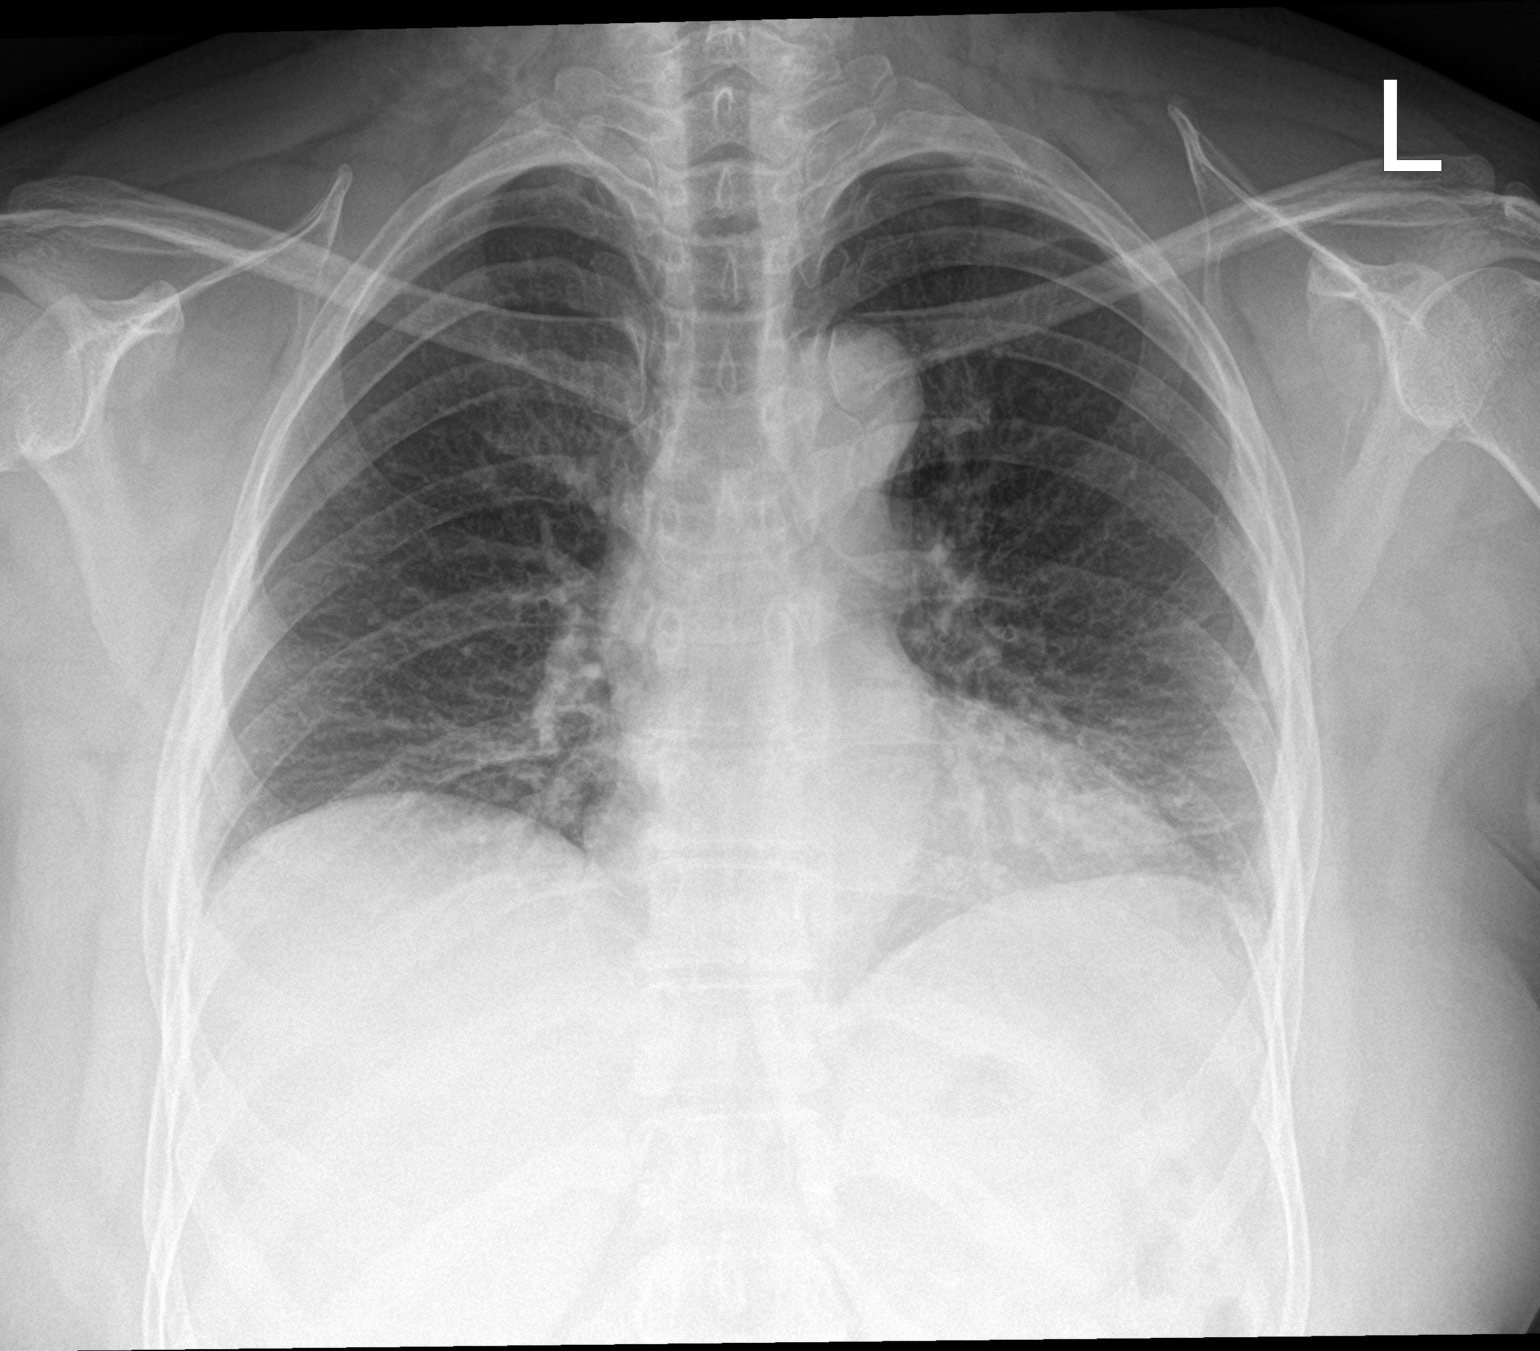

[chest lat]
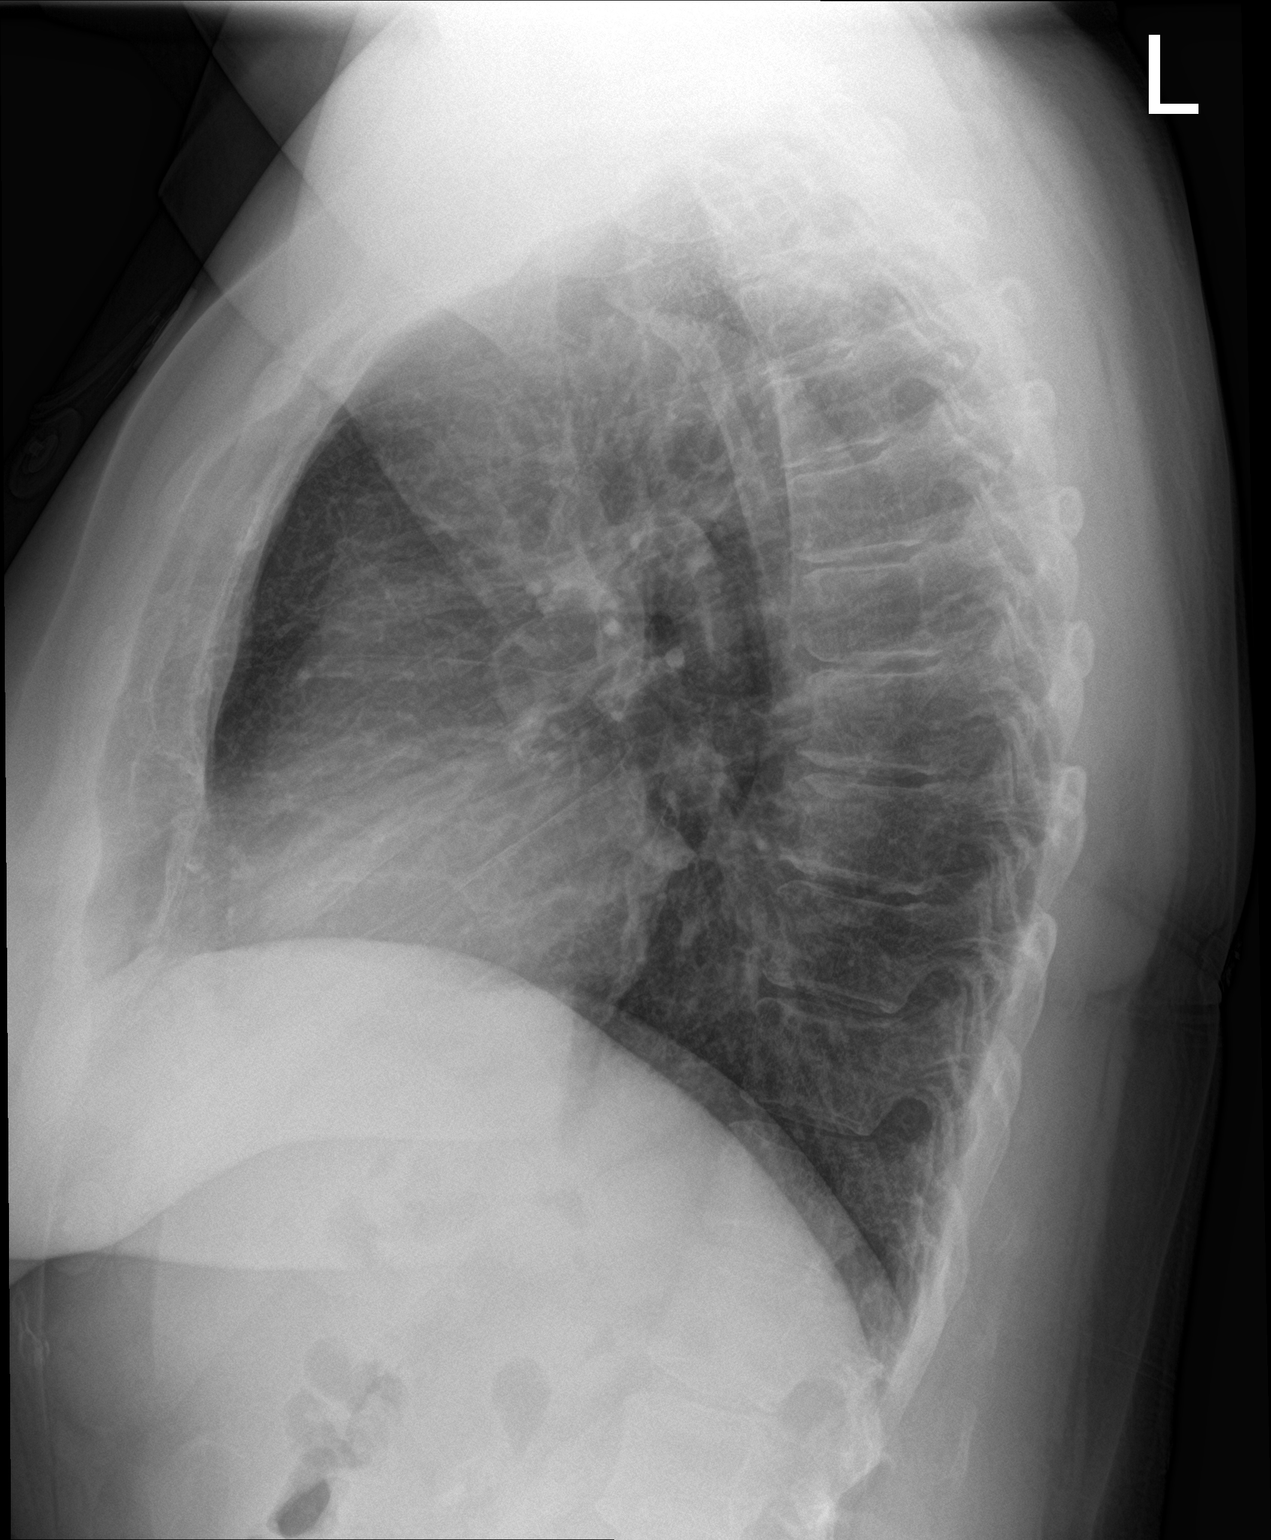

[2 of 2 positions shown; findings below may reference images not displayed]

FINDINGS: Low lung volumes with bibasilar atelectasis on the frontal view.The
cardiomediastinal contours are normal. Pulmonary vasculature is
normal. No consolidation, pleural effusion, or pneumothorax. No
acute osseous abnormalities are seen.
IMPRESSION: Low lung volumes with bibasilar atelectasis.

## 2020-08-28 ENCOUNTER — Ambulatory Visit: Payer: No Typology Code available for payment source | Admitting: Pharmacist

## 2020-08-29 ENCOUNTER — Encounter: Payer: Self-pay | Admitting: Pharmacist

## 2020-08-29 ENCOUNTER — Other Ambulatory Visit: Payer: Self-pay

## 2020-08-29 ENCOUNTER — Ambulatory Visit: Payer: Self-pay | Attending: Nurse Practitioner | Admitting: Pharmacist

## 2020-08-29 VITALS — BP 136/82

## 2020-08-29 DIAGNOSIS — I10 Essential (primary) hypertension: Secondary | ICD-10-CM

## 2020-08-29 NOTE — Progress Notes (Signed)
   S:    PCP: Zelda  Patient arrives in good spirits. Presents to the clinic for hypertension evaluation, counseling, and management. Patient was referred and last seen by Primary Care Provider on 07/31/2020.   Medication adherence reported. Denies any chest pain/dyspnea/HA/blurred vision.   Current BP Medications include:  amlodipine 10 mg daily, lisinopril 10 mg daily   Dietary habits include: compliant with salt restriction; denies drinking caffeine   Exercise habits include:none outside of work Family / Social history:  -Fhx: no pertinent positives  -Tobacco: never smoker  -Alcohol: denies use  O:  Vitals:   08/29/20 1611  BP: 136/82    Home BP readings: none  Last 3 Office BP readings: BP Readings from Last 3 Encounters:  08/29/20 136/82  07/31/20 (!) 154/91  04/23/20 136/82    BMET    Component Value Date/Time   NA 142 07/31/2020 1645   K 3.8 07/31/2020 1645   CL 106 07/31/2020 1645   CO2 22 07/31/2020 1645   GLUCOSE 100 (H) 07/31/2020 1645   GLUCOSE 107 (H) 11/09/2017 0035   BUN 13 07/31/2020 1645   CREATININE 0.61 07/31/2020 1645   CREATININE 0.54 03/16/2014 1656   CALCIUM 9.2 07/31/2020 1645   GFRNONAA 104 04/23/2020 1449   GFRNONAA >89 03/16/2014 1656   GFRAA 120 04/23/2020 1449   GFRAA >89 03/16/2014 1656    Renal function: CrCl cannot be calculated (Patient's most recent lab result is older than the maximum 21 days allowed.).  Clinical ASCVD: No  The 10-year ASCVD risk score Mikey Bussing DC Jr., et al., 2013) is: 5.3%   Values used to calculate the score:     Age: 60 years     Sex: Female     Is Non-Hispanic African American: No     Diabetic: No     Tobacco smoker: No     Systolic Blood Pressure: 950 mmHg     Is BP treated: Yes     HDL Cholesterol: 40 mg/dL     Total Cholesterol: 202 mg/dL  A/P: Hypertension longstanding currently above goal but improved on current medications. BP Goal = < 130/80 mmHg. Medication adherence reported.  -Continued  current regimen. Advised to return in 1 month for recheck.  -Advised to check home BP. -Counseled on lifestyle modifications for blood pressure control including reduced dietary sodium, increased exercise, adequate sleep.  Results reviewed and written information provided.   Total time in face-to-face counseling 30 minutes.   F/U Clinic Visit in 1 month.   Benard Halsted, PharmD, [place], [place] [phone]

## 2020-09-21 ENCOUNTER — Other Ambulatory Visit: Payer: Self-pay

## 2020-09-21 MED FILL — Amlodipine Besylate Tab 10 MG (Base Equivalent): ORAL | 30 days supply | Qty: 30 | Fill #2 | Status: AC

## 2020-09-21 MED FILL — Lisinopril Tab 10 MG: ORAL | 30 days supply | Qty: 30 | Fill #2 | Status: AC

## 2020-09-24 ENCOUNTER — Other Ambulatory Visit: Payer: Self-pay

## 2020-09-24 ENCOUNTER — Encounter: Payer: Self-pay | Admitting: Pharmacist

## 2020-09-24 ENCOUNTER — Ambulatory Visit: Payer: Self-pay | Attending: Nurse Practitioner | Admitting: Pharmacist

## 2020-09-24 VITALS — BP 138/78

## 2020-09-24 DIAGNOSIS — I10 Essential (primary) hypertension: Secondary | ICD-10-CM

## 2020-09-24 NOTE — Progress Notes (Signed)
   S:    PCP: Zelda  Patient arrives in good spirits. Presents to the clinic for hypertension evaluation, counseling, and management. Patient was referred and last seen by Primary Care Provider on 07/31/2020. I saw her on 08/29/2020 and BP was okay.  Medication adherence reported. Denies any chest pain/dyspnea/HA/blurred vision.   Current BP Medications include:  amlodipine 10 mg daily, lisinopril 10 mg daily   Dietary habits include: compliant with salt restriction; denies drinking caffeine   Exercise habits include:none outside of work Family / Social history:  -Fhx: no pertinent positives  -Tobacco: never smoker  -Alcohol: denies use  O:  Vitals:   09/24/20 1632  BP: 138/78   Home BP readings: reports that she checks at home and BP is always 110s/70s. No meter or log with her today.   Last 3 Office BP readings: BP Readings from Last 3 Encounters:  09/24/20 138/78  08/29/20 136/82  07/31/20 (!) 154/91    BMET    Component Value Date/Time   NA 142 07/31/2020 1645   K 3.8 07/31/2020 1645   CL 106 07/31/2020 1645   CO2 22 07/31/2020 1645   GLUCOSE 100 (H) 07/31/2020 1645   GLUCOSE 107 (H) 11/09/2017 0035   BUN 13 07/31/2020 1645   CREATININE 0.61 07/31/2020 1645   CREATININE 0.54 03/16/2014 1656   CALCIUM 9.2 07/31/2020 1645   GFRNONAA 104 04/23/2020 1449   GFRNONAA >89 03/16/2014 1656   GFRAA 120 04/23/2020 1449   GFRAA >89 03/16/2014 1656    Renal function: CrCl cannot be calculated (Patient's most recent lab result is older than the maximum 21 days allowed.).  Clinical ASCVD: No  The 10-year ASCVD risk score Mikey Bussing DC Jr., et al., 2013) is: 5.4%   Values used to calculate the score:     Age: 60 years     Sex: Female     Is Non-Hispanic African American: No     Diabetic: No     Tobacco smoker: No     Systolic Blood Pressure: 0000000 mmHg     Is BP treated: Yes     HDL Cholesterol: 40 mg/dL     Total Cholesterol: 202 mg/dL  A/P: Hypertension longstanding  currently close to goal on current medications. BP Goal = < 130/80 mmHg. Medication adherence reported. I recommended to increase lisinopril to 20 mg daily, however, pt did not wish to do so. She reports goal BP at home but I am unable to verify this.  -Continued current regimen.   -Advised to check home BP. -Counseled on lifestyle modifications for blood pressure control including reduced dietary sodium, increased exercise, adequate sleep.  Results reviewed and written information provided.   Total time in face-to-face counseling 30 minutes.   F/U Clinic Visit with PCP.   Benard Halsted, PharmD, [place], [place] [phone]

## 2020-09-27 ENCOUNTER — Other Ambulatory Visit: Payer: Self-pay

## 2020-09-27 ENCOUNTER — Ambulatory Visit: Payer: Self-pay | Attending: Nurse Practitioner

## 2020-10-10 ENCOUNTER — Telehealth: Payer: Self-pay | Admitting: Nurse Practitioner

## 2020-10-10 NOTE — Telephone Encounter (Signed)
Pt was sent a letter from financial dept. Inform them, that the application they submitted was incomplete, since they were missing some documentation at the time of the appointment, Pt need to reschedule and resubmit all new papers and application for CAFA and [place], P.S. old documents has been sent back by mail to the Pt and Pt. need to make a new appt. 

## 2020-10-30 ENCOUNTER — Other Ambulatory Visit: Payer: Self-pay

## 2020-10-30 MED FILL — Amlodipine Besylate Tab 10 MG (Base Equivalent): ORAL | 30 days supply | Qty: 30 | Fill #3 | Status: AC

## 2020-10-30 MED FILL — Lisinopril Tab 10 MG: ORAL | 30 days supply | Qty: 30 | Fill #3 | Status: AC

## 2020-10-31 ENCOUNTER — Other Ambulatory Visit: Payer: Self-pay

## 2020-10-31 ENCOUNTER — Ambulatory Visit: Payer: No Typology Code available for payment source | Attending: Nurse Practitioner | Admitting: Nurse Practitioner

## 2020-10-31 ENCOUNTER — Telehealth: Payer: Self-pay | Admitting: Nurse Practitioner

## 2020-10-31 NOTE — Telephone Encounter (Signed)
No answer and unable to leave voicemail.  Call assisted by Spanish interpreter Mardene Celeste [phone]

## 2020-11-30 ENCOUNTER — Other Ambulatory Visit: Payer: Self-pay

## 2020-11-30 MED FILL — Lisinopril Tab 10 MG: ORAL | 30 days supply | Qty: 30 | Fill #4 | Status: AC

## 2020-11-30 MED FILL — Amlodipine Besylate Tab 10 MG (Base Equivalent): ORAL | 30 days supply | Qty: 30 | Fill #4 | Status: AC

## 2021-01-03 ENCOUNTER — Other Ambulatory Visit: Payer: Self-pay | Admitting: Nurse Practitioner

## 2021-01-03 ENCOUNTER — Other Ambulatory Visit: Payer: Self-pay

## 2021-01-03 DIAGNOSIS — I10 Essential (primary) hypertension: Secondary | ICD-10-CM

## 2021-01-03 MED ORDER — AMLODIPINE BESYLATE 10 MG PO TABS
ORAL_TABLET | Freq: Every day | ORAL | 0 refills | Status: DC
  Filled 2021-01-03: qty 30, 30d supply, fill #0
  Filled 2021-02-15: qty 30, 30d supply, fill #1
  Filled 2021-03-22: qty 30, 30d supply, fill #0

## 2021-01-03 MED ORDER — LISINOPRIL 10 MG PO TABS
ORAL_TABLET | Freq: Every day | ORAL | 0 refills | Status: DC
  Filled 2021-01-03: qty 30, 30d supply, fill #0
  Filled 2021-02-15: qty 30, 30d supply, fill #1
  Filled 2021-03-22: qty 30, 30d supply, fill #0

## 2021-01-03 NOTE — Telephone Encounter (Signed)
Requested Prescriptions  Pending Prescriptions Disp Refills  . lisinopril (ZESTRIL) 10 MG tablet 90 tablet 0    Sig: TAKE 1 TABLET (10 MG TOTAL) BY MOUTH DAILY.     Cardiovascular:  ACE Inhibitors Passed - 01/03/2021 12:12 PM      Passed - Cr in normal range and within 180 days    Creat  Date Value Ref Range Status  03/16/2014 0.54 0.50 - 1.10 mg/dL Final   Creatinine, Ser  Date Value Ref Range Status  07/31/2020 0.61 0.57 - 1.00 mg/dL Final         Passed - K in normal range and within 180 days    Potassium  Date Value Ref Range Status  07/31/2020 3.8 3.5 - 5.2 mmol/L Final         Passed - Patient is not pregnant      Passed - Last BP in normal range    BP Readings from Last 1 Encounters:  09/24/20 138/78         Passed - Valid encounter within last 6 months    Recent Outpatient Visits          3 months ago Essential hypertension   [place], Jarome Matin, RPH-CPP   4 months ago Essential hypertension   [place], Jarome Matin, RPH-CPP   5 months ago Essential hypertension   [place], Vernia Buff, NP   8 months ago Essential hypertension   [place], Vernia Buff, NP   1 year ago Essential hypertension   [place], NP             . amLODipine (NORVASC) 10 MG tablet 90 tablet 0    Sig: TAKE 1 TABLET (10 MG TOTAL) BY MOUTH DAILY.     Cardiovascular:  Calcium Channel Blockers Passed - 01/03/2021 12:12 PM      Passed - Last BP in normal range    BP Readings from Last 1 Encounters:  09/24/20 138/78         Passed - Valid encounter within last 6 months    Recent Outpatient Visits          3 months ago Essential hypertension   [place], Jarome Matin, RPH-CPP   4 months ago Essential hypertension   [place], Jarome Matin, RPH-CPP   5 months ago Essential hypertension   [place], Vernia Buff, NP   8 months ago Essential hypertension   [place], Vernia Buff, NP   1 year ago Essential hypertension   [place], Vernia Buff, NP

## 2021-01-04 ENCOUNTER — Other Ambulatory Visit: Payer: Self-pay

## 2021-01-07 ENCOUNTER — Other Ambulatory Visit: Payer: Self-pay

## 2021-02-15 ENCOUNTER — Other Ambulatory Visit: Payer: Self-pay

## 2021-03-22 ENCOUNTER — Other Ambulatory Visit: Payer: Self-pay

## 2021-04-19 ENCOUNTER — Other Ambulatory Visit: Payer: Self-pay

## 2021-04-19 ENCOUNTER — Other Ambulatory Visit: Payer: Self-pay | Admitting: Nurse Practitioner

## 2021-04-19 DIAGNOSIS — I10 Essential (primary) hypertension: Secondary | ICD-10-CM

## 2021-04-22 ENCOUNTER — Other Ambulatory Visit: Payer: Self-pay

## 2021-04-29 ENCOUNTER — Telehealth: Payer: Self-pay

## 2021-04-29 NOTE — Telephone Encounter (Signed)
Called pt left a vm to return call to the office with the help of interpretor manuel. Wanted to let pt know appt for 2/28 is virtual not in office.

## 2021-04-30 ENCOUNTER — Encounter: Payer: Self-pay | Admitting: Nurse Practitioner

## 2021-04-30 ENCOUNTER — Ambulatory Visit: Payer: Self-pay

## 2021-04-30 ENCOUNTER — Other Ambulatory Visit: Payer: Self-pay

## 2021-04-30 ENCOUNTER — Ambulatory Visit: Payer: Self-pay | Attending: Nurse Practitioner | Admitting: Nurse Practitioner

## 2021-04-30 ENCOUNTER — Other Ambulatory Visit: Payer: Self-pay | Admitting: Nurse Practitioner

## 2021-04-30 DIAGNOSIS — Z130 Encounter for screening for diseases of the blood and blood-forming organs and certain disorders involving the immune mechanism: Secondary | ICD-10-CM

## 2021-04-30 DIAGNOSIS — I10 Essential (primary) hypertension: Secondary | ICD-10-CM

## 2021-04-30 DIAGNOSIS — R7303 Prediabetes: Secondary | ICD-10-CM

## 2021-04-30 DIAGNOSIS — E785 Hyperlipidemia, unspecified: Secondary | ICD-10-CM

## 2021-04-30 MED ORDER — LISINOPRIL 10 MG PO TABS
ORAL_TABLET | Freq: Every day | ORAL | 0 refills | Status: DC
Start: 2021-04-30 — End: 2021-08-09
  Filled 2021-04-30: qty 30, 30d supply, fill #0
  Filled 2021-06-03: qty 30, 30d supply, fill #1
  Filled 2021-07-08: qty 30, 30d supply, fill #2

## 2021-04-30 MED ORDER — AMLODIPINE BESYLATE 10 MG PO TABS
ORAL_TABLET | Freq: Every day | ORAL | 0 refills | Status: DC
  Filled 2021-04-30: qty 30, 30d supply, fill #0
  Filled 2021-06-03: qty 30, 30d supply, fill #1
  Filled 2021-07-08: qty 30, 30d supply, fill #2

## 2021-04-30 NOTE — Progress Notes (Signed)
Virtual Visit via Telephone Note Due to national recommendations of social distancing due to [place] 19, telehealth visit is felt to be most appropriate for this patient at this time.  I discussed the limitations, risks, security and privacy concerns of performing an evaluation and management service by telephone and the availability of in person appointments. I also discussed with the patient that there may be a patient responsible charge related to this service. The patient expressed understanding and agreed to proceed.    I connected with Alexandra Jennings on 04/30/21  at   9:30 AM EST  EDT by telephone and verified that I am speaking with the correct person using two identifiers.  Location of Patient: Private Residence   Location of Provider: [place] and [place] participating in Telemedicine visit: Geryl Rankins FNP-BC [place]  Spanish Interpreter Chaves   History of Present Illness: Telemedicine visit for: HTN She has a past medical history of Hypertension and Prediabetes.   HTN Blood pressure controlled with amlodipine 10 mg daily and lisinopril 10 mg daily. She has a BP monitor however she has not been checking her blood pressure at home.  BP Readings from Last 3 Encounters:  09/24/20 138/78  08/29/20 136/82  07/31/20 (!) 154/91     Prediabetes Controlled with diet only at this time.  Lab Results  Component Value Date   HGBA1C 5.3 04/23/2020     Past Medical History:  Diagnosis Date   Hypertension    Prediabetes     Past Surgical History:  Procedure Laterality Date   COLONOSCOPY N/A 01/30/2015   Procedure: COLONOSCOPY;  Surgeon: Danie Binder, MD;  [facility];  Service: Endoscopy;  Laterality: N/A;  1300    TUBAL LIGATION      Family History  Problem Relation Age of Onset   Cancer Mother        uterine   Cancer Father        prostate    Social History   Socioeconomic History    Marital status: Divorced    Spouse name: Not on file   Number of children: 3   Years of education: Not on file   Highest education level: 8th grade  Occupational History   Not on file  Tobacco Use   Smoking status: Never   Smokeless tobacco: Never  Vaping Use   Vaping Use: Never used  Substance and Sexual Activity   Alcohol use: Never   Drug use: Never   Sexual activity: Not Currently    Birth control/protection: Surgical  Other Topics Concern   Not on file  Social History Narrative   ** Merged History Encounter **       Social Determinants of Health   Financial Resource Strain: Not on file  Food Insecurity: Not on file  Transportation Needs: Not on file  Physical Activity: Not on file  Stress: Not on file  Social Connections: Not on file     Observations/Objective: Awake, alert and oriented x 3   Review of Systems  Constitutional:  Negative for fever, malaise/fatigue and weight loss.  HENT: Negative.  Negative for nosebleeds.   Eyes: Negative.  Negative for blurred vision, double vision and photophobia.  Respiratory: Negative.  Negative for cough and shortness of breath.   Cardiovascular: Negative.  Negative for chest pain, palpitations and leg swelling.  Gastrointestinal: Negative.  Negative for heartburn, nausea and vomiting.  Musculoskeletal: Negative.  Negative for myalgias.  Neurological:  Negative.  Negative for dizziness, focal weakness, seizures and headaches.  Psychiatric/Behavioral: Negative.  Negative for suicidal ideas.    Assessment and Plan: Diagnoses and all orders for this visit:  Essential hypertension -     amLODipine (NORVASC) 10 MG tablet; TAKE 1 TABLET (10 MG TOTAL) BY MOUTH DAILY. -     lisinopril (ZESTRIL) 10 MG tablet; TAKE 1 TABLET (10 MG TOTAL) BY MOUTH DAILY. Continue all antihypertensives as prescribed.  Remember to bring in your blood pressure log with you for your follow up appointment.  DASH/Mediterranean Diets are healthier  choices for HTN.    Prediabetes A1c pending CMP pending    Follow Up Instructions Return in about 3 months (around 07/28/2021) for PAP SMEAR.     I discussed the assessment and treatment plan with the patient. The patient was provided an opportunity to ask questions and all were answered. The patient agreed with the plan and demonstrated an understanding of the instructions.   The patient was advised to call back or seek an in-person evaluation if the symptoms worsen or if the condition fails to improve as anticipated.  I provided 12 minutes of non-face-to-face time during this encounter including median intraservice time, reviewing previous notes, labs, imaging, medications and explaining diagnosis and management.  [name], FNP-BC

## 2021-05-01 ENCOUNTER — Other Ambulatory Visit: Payer: Self-pay

## 2021-05-01 LAB — CMP14+EGFR
ALT: 23 IU/L (ref 0–32)
AST: 18 IU/L (ref 0–40)
Albumin/Globulin Ratio: 1.8 (ref 1.2–2.2)
Albumin: 4.9 g/dL (ref 3.8–4.9)
Alkaline Phosphatase: 87 IU/L (ref 44–121)
BUN/Creatinine Ratio: 27 — ABNORMAL HIGH (ref 9–23)
BUN: 17 mg/dL (ref 6–24)
Bilirubin Total: 0.2 mg/dL (ref 0.0–1.2)
CO2: 23 mmol/L (ref 20–29)
Calcium: 10 mg/dL (ref 8.7–10.2)
Chloride: 105 mmol/L (ref 96–106)
Creatinine, Ser: 0.62 mg/dL (ref 0.57–1.00)
Globulin, Total: 2.7 g/dL (ref 1.5–4.5)
Glucose: 97 mg/dL (ref 70–99)
Potassium: 4.4 mmol/L (ref 3.5–5.2)
Sodium: 141 mmol/L (ref 134–144)
Total Protein: 7.6 g/dL (ref 6.0–8.5)
eGFR: 103 mL/min/{1.73_m2} (ref >59)

## 2021-05-01 LAB — LIPID PANEL
Chol/HDL Ratio: 4.6 ratio — ABNORMAL HIGH (ref 0.0–4.4)
Cholesterol, Total: 212 mg/dL — ABNORMAL HIGH (ref 100–199)
HDL: 46 mg/dL (ref >39)
LDL Chol Calc (NIH): 141 mg/dL — ABNORMAL HIGH (ref 0–99)
Triglycerides: 139 mg/dL (ref 0–149)
VLDL Cholesterol Cal: 25 mg/dL (ref 5–40)

## 2021-05-01 LAB — CBC
Hematocrit: 42.6 % (ref 34.0–46.6)
Hemoglobin: 14.4 g/dL (ref 11.1–15.9)
MCH: 31.1 pg (ref 26.6–33.0)
MCHC: 33.8 g/dL (ref 31.5–35.7)
MCV: 92 fL (ref 79–97)
Platelets: 245 10*3/uL (ref 150–450)
RBC: 4.63 x10E6/uL (ref 3.77–5.28)
RDW: 12.6 % (ref 11.7–15.4)
WBC: 6.4 10*3/uL (ref 3.4–10.8)

## 2021-05-01 LAB — HEMOGLOBIN A1C
Est. average glucose Bld gHb Est-mCnc: 120 mg/dL
Hgb A1c MFr Bld: 5.8 % — ABNORMAL HIGH (ref 4.8–5.6)

## 2021-05-03 ENCOUNTER — Other Ambulatory Visit: Payer: Self-pay

## 2021-05-03 ENCOUNTER — Ambulatory Visit: Payer: Self-pay | Attending: Nurse Practitioner

## 2021-05-21 ENCOUNTER — Telehealth: Payer: Self-pay | Admitting: Nurse Practitioner

## 2021-05-21 NOTE — Telephone Encounter (Signed)
Pt was sent a letter from financial dept. Inform them, that the application they submitted was incomplete, since they were missing some documentation at the time of the appointment, Pt need to reschedule and resubmit all new papers and application for CAFA and [place], P.S. old documents has been sent back by mail to the Pt and Pt. need to make a new appt. 

## 2021-06-03 ENCOUNTER — Other Ambulatory Visit: Payer: Self-pay

## 2021-07-08 ENCOUNTER — Other Ambulatory Visit: Payer: Self-pay

## 2021-07-09 ENCOUNTER — Other Ambulatory Visit: Payer: Self-pay

## 2021-07-10 ENCOUNTER — Ambulatory Visit: Payer: No Typology Code available for payment source | Admitting: Nurse Practitioner

## 2021-08-09 ENCOUNTER — Other Ambulatory Visit: Payer: Self-pay | Admitting: Nurse Practitioner

## 2021-08-09 ENCOUNTER — Other Ambulatory Visit: Payer: Self-pay

## 2021-08-09 DIAGNOSIS — I10 Essential (primary) hypertension: Secondary | ICD-10-CM

## 2021-08-09 MED ORDER — LISINOPRIL 10 MG PO TABS
ORAL_TABLET | Freq: Every day | ORAL | 2 refills | Status: DC
  Filled 2021-08-09: qty 30, 30d supply, fill #0
  Filled 2021-09-10: qty 30, 30d supply, fill #1
  Filled 2021-10-16: qty 30, 30d supply, fill #2

## 2021-08-09 MED ORDER — AMLODIPINE BESYLATE 10 MG PO TABS
ORAL_TABLET | Freq: Every day | ORAL | 2 refills | Status: DC
  Filled 2021-08-09: qty 30, 30d supply, fill #0
  Filled 2021-09-10: qty 30, 30d supply, fill #1
  Filled 2021-10-16: qty 30, 30d supply, fill #2

## 2021-08-12 ENCOUNTER — Other Ambulatory Visit: Payer: Self-pay

## 2021-09-10 ENCOUNTER — Other Ambulatory Visit: Payer: Self-pay

## 2021-10-02 ENCOUNTER — Other Ambulatory Visit (HOSPITAL_COMMUNITY)
Admission: RE | Admit: 2021-10-02 | Discharge: 2021-10-02 | Disposition: A | Discharge status: Home / Self Care | Payer: Self-pay | Source: Ambulatory Visit | Attending: Nurse Practitioner | Admitting: Nurse Practitioner

## 2021-10-02 ENCOUNTER — Ambulatory Visit: Payer: Self-pay | Attending: Nurse Practitioner | Admitting: Nurse Practitioner

## 2021-10-02 ENCOUNTER — Encounter: Payer: Self-pay | Admitting: Nurse Practitioner

## 2021-10-02 VITALS — BP 136/85 | HR 85 | Temp 98.3°F | Ht 62.0 in | Wt 172.6 lb

## 2021-10-02 DIAGNOSIS — R7303 Prediabetes: Secondary | ICD-10-CM

## 2021-10-02 DIAGNOSIS — Z124 Encounter for screening for malignant neoplasm of cervix: Secondary | ICD-10-CM

## 2021-10-02 DIAGNOSIS — Z114 Encounter for screening for human immunodeficiency virus [HIV]: Secondary | ICD-10-CM

## 2021-10-02 DIAGNOSIS — Z1231 Encounter for screening mammogram for malignant neoplasm of breast: Secondary | ICD-10-CM

## 2021-10-02 DIAGNOSIS — E785 Hyperlipidemia, unspecified: Secondary | ICD-10-CM

## 2021-10-02 NOTE — Progress Notes (Signed)
Assessment & Plan:  Alexandra Jennings was seen today for gynecologic exam.  Diagnoses and all orders for this visit:  Encounter for Papanicolaou smear for cervical cancer screening -     Cytology - PAP -     Cervicovaginal ancillary only  Breast cancer screening by mammogram -     MS DIGITAL SCREENING TOMO BILATERAL; Future  Encounter for screening for HIV -     HIV antibody (with reflex)  Prediabetes -     Hemoglobin A1c -     Basic metabolic panel  Dyslipidemia, goal LDL below 70 -     Lipid panel    Patient has been counseled on age-appropriate routine health concerns for screening and prevention. These are reviewed and up-to-date. Referrals have been placed accordingly. Immunizations are up-to-date or declined.    Subjective:   Chief Complaint  Patient presents with   Gynecologic Exam   Gynecologic Exam Pertinent negatives include no abdominal pain, chills, constipation, diarrhea, fever, headaches, nausea, rash or vomiting.   Alexandra Jennings 61 y.o. female presents to office today for pap smear.   Review of Systems  Constitutional: Negative.  Negative for chills, fever, malaise/fatigue and weight loss.  Respiratory: Negative.  Negative for cough, sputum production, shortness of breath and wheezing.   Cardiovascular: Negative.  Negative for chest pain and leg swelling.  Gastrointestinal: Negative.  Negative for abdominal pain, blood in stool, constipation, diarrhea, heartburn, melena, nausea and vomiting.  Genitourinary: Negative.   Skin: Negative.  Negative for rash.  Neurological: Negative.  Negative for dizziness, tremors, speech change, focal weakness, seizures and headaches.  Psychiatric/Behavioral: Negative.  Negative for depression and suicidal ideas. The patient is not nervous/anxious and does not have insomnia.     Past Medical History:  Diagnosis Date   Hypertension    Prediabetes     Past Surgical History:  Procedure Laterality Date   COLONOSCOPY  N/A 01/30/2015   Procedure: COLONOSCOPY;  Surgeon: Danie Binder, MD;  [facility];  Service: Endoscopy;  Laterality: N/A;  1300    TUBAL LIGATION      Family History  Problem Relation Age of Onset   Cancer Mother        uterine   Cancer Father        prostate    Social History Reviewed with no changes to be made today.   Outpatient Medications Prior to Visit  Medication Sig Dispense Refill   amLODipine (NORVASC) 10 MG tablet TAKE 1 TABLET (10 MG TOTAL) BY MOUTH DAILY. 30 tablet 2   lisinopril (ZESTRIL) 10 MG tablet TAKE 1 TABLET (10 MG TOTAL) BY MOUTH DAILY. 30 tablet 2   No facility-administered medications prior to visit.    No Known Allergies     Objective:    BP 136/85   Pulse 85   Temp 98.3 F (36.8 C) (Oral)   Ht '5\' 2"'$  (1.575 m)   Wt 172 lb 9.6 oz (78.3 kg)   LMP 04/05/2011   SpO2 100%   BMI 31.57 kg/m  Wt Readings from Last 3 Encounters:  10/02/21 172 lb 9.6 oz (78.3 kg)  07/31/20 170 lb 9.6 oz (77.4 kg)  04/23/20 178 lb (80.7 kg)    Physical Exam Exam conducted with a chaperone present.  Constitutional:      Appearance: She is well-developed.  HENT:     Head: Normocephalic.  Cardiovascular:     Rate and Rhythm: Normal rate and regular rhythm.     Heart  sounds: Normal heart sounds.  Pulmonary:     Effort: Pulmonary effort is normal.     Breath sounds: Normal breath sounds.  Abdominal:     General: Bowel sounds are normal.     Palpations: Abdomen is soft.     Hernia: There is no hernia in the left inguinal area.  Genitourinary:    Exam position: Lithotomy position.     Labia:        Right: No rash, tenderness, lesion or injury.        Left: No rash, tenderness, lesion or injury.      Vagina: Normal. No signs of injury and foreign body. No vaginal discharge, erythema, tenderness or bleeding.     Cervix: Discharge (thick white) present.     Uterus: Not deviated and not enlarged.      Adnexa:        Right: No mass, tenderness or  fullness.         Left: No mass, tenderness or fullness.       Rectum: Normal. No external hemorrhoid.  Lymphadenopathy:     Lower Body: No right inguinal adenopathy. No left inguinal adenopathy.  Skin:    General: Skin is warm and dry.  Neurological:     Mental Status: She is alert and oriented to person, place, and time.  Psychiatric:        Behavior: Behavior normal.        Thought Content: Thought content normal.        Judgment: Judgment normal.          Patient has been counseled extensively about nutrition and exercise as well as the importance of adherence with medications and regular follow-up. The patient was given clear instructions to go to ER or return to medical center if symptoms don't improve, worsen or new problems develop. The patient verbalized understanding.   Follow-up: Return in about 6 months (around 04/04/2022) for prediabetes.   Alexandra Pounds, FNP-BC [facility], [place]   10/02/2021, 9:31 AM

## 2021-10-03 LAB — HEMOGLOBIN A1C
Est. average glucose Bld gHb Est-mCnc: 117 mg/dL
Hgb A1c MFr Bld: 5.7 % — ABNORMAL HIGH (ref 4.8–5.6)

## 2021-10-03 LAB — HIV ANTIBODY (ROUTINE TESTING W REFLEX): HIV Screen 4th Generation wRfx: NONREACTIVE

## 2021-10-03 LAB — BASIC METABOLIC PANEL
BUN/Creatinine Ratio: 20 (ref 9–23)
BUN: 12 mg/dL (ref 6–24)
CO2: 26 mmol/L (ref 20–29)
Calcium: 8.3 mg/dL — ABNORMAL LOW (ref 8.7–10.2)
Chloride: 106 mmol/L (ref 96–106)
Creatinine, Ser: 0.61 mg/dL (ref 0.57–1.00)
Glucose: 95 mg/dL (ref 70–99)
Potassium: 4.5 mmol/L (ref 3.5–5.2)
Sodium: 143 mmol/L (ref 134–144)
eGFR: 103 mL/min/{1.73_m2} (ref >59)

## 2021-10-03 LAB — LIPID PANEL
Chol/HDL Ratio: 4.6 ratio — ABNORMAL HIGH (ref 0.0–4.4)
Cholesterol, Total: 190 mg/dL (ref 100–199)
HDL: 41 mg/dL (ref >39)
LDL Chol Calc (NIH): 118 mg/dL — ABNORMAL HIGH (ref 0–99)
Triglycerides: 176 mg/dL — ABNORMAL HIGH (ref 0–149)
VLDL Cholesterol Cal: 31 mg/dL (ref 5–40)

## 2021-10-03 LAB — CERVICOVAGINAL ANCILLARY ONLY
Bacterial Vaginitis (gardnerella): NEGATIVE
Candida Glabrata: NEGATIVE
Candida Vaginitis: NEGATIVE
Chlamydia: NEGATIVE
Comment: NEGATIVE
Comment: NEGATIVE
Comment: NEGATIVE
Comment: NEGATIVE
Comment: NEGATIVE
Comment: NORMAL
Neisseria Gonorrhea: NEGATIVE
Trichomonas: NEGATIVE

## 2021-10-04 LAB — CYTOLOGY - PAP
Comment: NEGATIVE
Diagnosis: NEGATIVE
High risk HPV: NEGATIVE

## 2021-10-16 ENCOUNTER — Other Ambulatory Visit: Payer: Self-pay

## 2021-11-15 ENCOUNTER — Other Ambulatory Visit: Payer: Self-pay | Admitting: Pharmacist

## 2021-11-15 ENCOUNTER — Other Ambulatory Visit: Payer: Self-pay

## 2021-11-15 ENCOUNTER — Other Ambulatory Visit: Payer: Self-pay | Admitting: Family Medicine

## 2021-11-15 DIAGNOSIS — I10 Essential (primary) hypertension: Secondary | ICD-10-CM

## 2021-11-15 MED ORDER — AMLODIPINE BESYLATE 10 MG PO TABS
10.0000 mg | ORAL_TABLET | Freq: Every day | ORAL | 2 refills | Status: DC
  Filled 2021-11-15: qty 30, 30d supply, fill #0
  Filled 2021-12-17: qty 30, 30d supply, fill #1
  Filled 2022-01-16: qty 30, 30d supply, fill #2

## 2021-11-15 MED ORDER — LISINOPRIL 10 MG PO TABS
10.0000 mg | ORAL_TABLET | Freq: Every day | ORAL | 2 refills | Status: DC
  Filled 2021-11-15: qty 30, 30d supply, fill #0
  Filled 2021-12-17: qty 30, 30d supply, fill #1
  Filled 2022-01-16: qty 30, 30d supply, fill #2

## 2021-12-11 ENCOUNTER — Telehealth: Payer: Self-pay

## 2021-12-11 NOTE — Telephone Encounter (Signed)
Telephoned patient with interpreter Truitt Merle). Left voice message with [facility] (scholarship) contact information.

## 2021-12-17 ENCOUNTER — Other Ambulatory Visit: Payer: Self-pay

## 2022-01-09 ENCOUNTER — Ambulatory Visit
Admission: RE | Admit: 2022-01-09 | Discharge: 2022-01-09 | Disposition: A | Discharge status: Home / Self Care | Payer: No Typology Code available for payment source | Source: Ambulatory Visit | Attending: Nurse Practitioner | Admitting: Nurse Practitioner

## 2022-01-09 DIAGNOSIS — Z1231 Encounter for screening mammogram for malignant neoplasm of breast: Secondary | ICD-10-CM

## 2022-01-16 ENCOUNTER — Other Ambulatory Visit: Payer: Self-pay

## 2022-02-18 ENCOUNTER — Other Ambulatory Visit: Payer: Self-pay | Admitting: Family Medicine

## 2022-02-18 ENCOUNTER — Other Ambulatory Visit: Payer: Self-pay

## 2022-02-18 DIAGNOSIS — I10 Essential (primary) hypertension: Secondary | ICD-10-CM

## 2022-02-18 MED ORDER — AMLODIPINE BESYLATE 10 MG PO TABS
10.0000 mg | ORAL_TABLET | Freq: Every day | ORAL | 2 refills | Status: DC
  Filled 2022-02-18: qty 30, 30d supply, fill #0
  Filled 2022-03-18: qty 30, 30d supply, fill #1
  Filled 2022-04-23: qty 30, 30d supply, fill #2

## 2022-02-18 MED ORDER — LISINOPRIL 10 MG PO TABS
10.0000 mg | ORAL_TABLET | Freq: Every day | ORAL | 2 refills | Status: DC
  Filled 2022-02-18: qty 30, 30d supply, fill #0
  Filled 2022-03-18: qty 30, 30d supply, fill #1
  Filled 2022-04-23: qty 30, 30d supply, fill #2

## 2022-03-18 ENCOUNTER — Other Ambulatory Visit: Payer: Self-pay

## 2022-04-23 ENCOUNTER — Other Ambulatory Visit: Payer: Self-pay

## 2022-04-24 ENCOUNTER — Other Ambulatory Visit: Payer: Self-pay

## 2022-04-25 LAB — GLUCOSE, POCT (MANUAL RESULT ENTRY): Glucose Fasting, POC: 108 mg/dL — AB (ref 70–99)

## 2022-04-25 NOTE — Progress Notes (Signed)
Pt is fasting

## 2022-05-21 ENCOUNTER — Other Ambulatory Visit: Payer: Self-pay | Admitting: Family Medicine

## 2022-05-21 ENCOUNTER — Other Ambulatory Visit: Payer: Self-pay

## 2022-05-21 DIAGNOSIS — I10 Essential (primary) hypertension: Secondary | ICD-10-CM

## 2022-05-21 MED ORDER — AMLODIPINE BESYLATE 10 MG PO TABS
10.0000 mg | ORAL_TABLET | Freq: Every day | ORAL | 0 refills | Status: DC
  Filled 2022-05-21: qty 30, 30d supply, fill #0

## 2022-05-21 MED ORDER — LISINOPRIL 10 MG PO TABS
10.0000 mg | ORAL_TABLET | Freq: Every day | ORAL | 0 refills | Status: DC
  Filled 2022-05-21: qty 30, 30d supply, fill #0

## 2022-06-05 ENCOUNTER — Other Ambulatory Visit: Payer: Self-pay

## 2022-06-05 ENCOUNTER — Encounter: Payer: Self-pay | Admitting: Physician Assistant

## 2022-06-05 ENCOUNTER — Ambulatory Visit: Payer: Self-pay | Attending: Physician Assistant | Admitting: Physician Assistant

## 2022-06-05 VITALS — BP 148/85 | HR 77 | Temp 98.3°F | Ht 62.0 in | Wt 172.0 lb

## 2022-06-05 DIAGNOSIS — I10 Essential (primary) hypertension: Secondary | ICD-10-CM

## 2022-06-05 DIAGNOSIS — M898X9 Other specified disorders of bone, unspecified site: Secondary | ICD-10-CM

## 2022-06-05 DIAGNOSIS — Z758 Other problems related to medical facilities and other health care: Secondary | ICD-10-CM

## 2022-06-05 DIAGNOSIS — M159 Polyosteoarthritis, unspecified: Secondary | ICD-10-CM

## 2022-06-05 DIAGNOSIS — Z603 Acculturation difficulty: Secondary | ICD-10-CM

## 2022-06-05 MED ORDER — MELOXICAM 7.5 MG PO TABS
7.5000 mg | ORAL_TABLET | Freq: Every day | ORAL | 2 refills | Status: DC
  Filled 2022-06-05: qty 30, 30d supply, fill #0

## 2022-06-05 MED ORDER — LISINOPRIL 10 MG PO TABS
10.0000 mg | ORAL_TABLET | Freq: Every day | ORAL | 1 refills | Status: DC
  Filled 2022-06-05 – 2022-07-02 (×2): qty 90, 90d supply, fill #0
  Filled 2022-10-13: qty 90, 90d supply, fill #1

## 2022-06-05 MED ORDER — AMLODIPINE BESYLATE 10 MG PO TABS
10.0000 mg | ORAL_TABLET | Freq: Every day | ORAL | 1 refills | Status: DC
  Filled 2022-06-05 – 2022-07-02 (×2): qty 90, 90d supply, fill #0
  Filled 2022-10-13: qty 90, 90d supply, fill #1

## 2022-06-05 NOTE — Progress Notes (Signed)
Alexandra Jennings [id number]  Generalized joint and bone pain Pain 4/5- 10  Has not taken medication to help with discomfort Pain is better with movement  Worse in the a.m.

## 2022-06-05 NOTE — Progress Notes (Signed)
Patient ID: Alexandra Jennings, female   DOB: [birth year], 62 y.o.   MRN: [id number]   Chelsi Shipley, is a 62 y.o. female  [id number]  [id number]  DOB - 12-22-1960  Chief Complaint  Patient presents with   Generalized Body Aches       Subjective:   Alexandra Jennings is a 62 y.o. female here today for pain in elbows, hands, knees, and ankles first thing in the morning and at bedtime.  The pain subsides in the daytime when she is active.  This has been occurring almost daily for about 3 months.  No fevers.  No tick bites.  She has not noticed swelling in any of her joints.  Needs RF of meds.     No problems updated.  ALLERGIES: No Known Allergies  PAST MEDICAL HISTORY: Past Medical History:  Diagnosis Date   Hypertension    Prediabetes     MEDICATIONS AT HOME: Prior to Admission medications   Medication Sig Start Date End Date Taking? Authorizing Provider  meloxicam (MOBIC) 7.5 MG tablet Take 1 tablet (7.5 mg total) by mouth daily as needed for pain 06/05/22  Yes Freeman Caldron M, PA-C  amLODipine (NORVASC) 10 MG tablet Take 1 tablet (10 mg total) by mouth daily. Please schedule PCP appointment. 06/05/22   Argentina Donovan, PA-C  lisinopril (ZESTRIL) 10 MG tablet Take 1 tablet (10 mg total) by mouth daily. Please schedule PCP appointment. 06/05/22   Grisela Mesch, Dionne Bucy, PA-C    ROS: Neg HEENT Neg resp Neg cardiac Neg GI Neg GU Neg MS Neg psych Neg neuro  Objective:   Vitals:   06/05/22 1108  BP: (!) 148/85  Pulse: 77  Temp: 98.3 F (36.8 C)  TempSrc: Oral  SpO2: 100%  Weight: 172 lb (78 kg)  Height: 5\' 2"  (1.575 m)   Exam General appearance : Awake, alert, not in any distress. Speech Clear. Not toxic looking HEENT: Atraumatic and Normocephalic Neck: Supple, no JVD. No cervical lymphadenopathy.  Chest: Good air entry bilaterally, CTAB.  No rales/rhonchi/wheezing CVS: S1 S2 regular, no murmurs.  Extremities: B/L Lower Ext shows no edema, both  legs are warm to touch.  No abnormalities of hands, wrists elbows or knees on exam.   Neurology: Awake alert, and oriented X 3, CN II-XII intact, Non focal Skin: No Rash  Data Review Lab Results  Component Value Date   HGBA1C 5.7 (H) 10/02/2021   HGBA1C 5.8 (H) 04/30/2021   HGBA1C 5.3 04/23/2020    Assessment & Plan   1. Bone pain - Comprehensive metabolic panel - TSH - Vitamin D, 25-hydroxy - Sedimentation Rate - meloxicam (MOBIC) 7.5 MG tablet; Take 1 tablet (7.5 mg total) by mouth daily as needed for pain  Dispense: 30 tablet; Refill: 2  2. Osteoarthritis of multiple joints, unspecified osteoarthritis type - meloxicam (MOBIC) 7.5 MG tablet; Take 1 tablet (7.5 mg total) by mouth daily as needed for pain  Dispense: 30 tablet; Refill: 2 - Vitamin D, 25-hydroxy  3. Language barrier [employer] "Hassan Rowan" interpreters used and additional time performing visit was required.   4. Essential hypertension Was out of meds-resume meds - amLODipine (NORVASC) 10 MG tablet; Take 1 tablet (10 mg total) by mouth daily. Please schedule PCP appointment.  Dispense: 90 tablet; Refill: 1 - lisinopril (ZESTRIL) 10 MG tablet; Take 1 tablet (10 mg total) by mouth daily. Please schedule PCP appointment.  Dispense: 90 tablet; Refill: 1    Return in about 4 months (around 10/05/2022)  for PCP.  The patient was given clear instructions to go to ER or return to medical center if symptoms don't improve, worsen or new problems develop. The patient verbalized understanding. The patient was told to call to get lab results if they haven't heard anything in the next week.      Freeman Caldron, PA-C [facility] [place], [place]   06/05/2022, 12:35 PM

## 2022-06-06 ENCOUNTER — Other Ambulatory Visit: Payer: Self-pay

## 2022-06-06 ENCOUNTER — Other Ambulatory Visit: Payer: Self-pay | Admitting: Physician Assistant

## 2022-06-06 LAB — COMPREHENSIVE METABOLIC PANEL
ALT: 38 IU/L — ABNORMAL HIGH (ref 0–32)
AST: 33 IU/L (ref 0–40)
Albumin/Globulin Ratio: 1.6 (ref 1.2–2.2)
Albumin: 4.5 g/dL (ref 3.8–4.9)
Alkaline Phosphatase: 85 IU/L (ref 44–121)
BUN/Creatinine Ratio: 19 (ref 12–28)
BUN: 12 mg/dL (ref 8–27)
Bilirubin Total: 0.3 mg/dL (ref 0.0–1.2)
CO2: 23 mmol/L (ref 20–29)
Calcium: 9.6 mg/dL (ref 8.7–10.3)
Chloride: 103 mmol/L (ref 96–106)
Creatinine, Ser: 0.63 mg/dL (ref 0.57–1.00)
Globulin, Total: 2.8 g/dL (ref 1.5–4.5)
Glucose: 87 mg/dL (ref 70–99)
Potassium: 4.6 mmol/L (ref 3.5–5.2)
Sodium: 140 mmol/L (ref 134–144)
Total Protein: 7.3 g/dL (ref 6.0–8.5)
eGFR: 101 mL/min/{1.73_m2} (ref >59)

## 2022-06-06 LAB — SEDIMENTATION RATE: Sed Rate: 5 mm/hr (ref 0–40)

## 2022-06-06 LAB — TSH: TSH: 1.62 u[IU]/mL (ref 0.450–4.500)

## 2022-06-06 LAB — VITAMIN D 25 HYDROXY (VIT D DEFICIENCY, FRACTURES): Vit D, 25-Hydroxy: 18.9 ng/mL — ABNORMAL LOW (ref 30.0–100.0)

## 2022-06-06 MED ORDER — VITAMIN D (ERGOCALCIFEROL) 1.25 MG (50000 UNIT) PO CAPS
50000.0000 [IU] | ORAL_CAPSULE | ORAL | 0 refills | Status: DC
  Filled 2022-06-06: qty 16, 112d supply, fill #0

## 2022-07-02 ENCOUNTER — Other Ambulatory Visit: Payer: Self-pay

## 2022-07-08 ENCOUNTER — Ambulatory Visit: Payer: Self-pay | Attending: Nurse Practitioner

## 2022-08-11 ENCOUNTER — Telehealth: Payer: Self-pay | Admitting: Nurse Practitioner

## 2022-08-11 NOTE — Telephone Encounter (Signed)
Copied from CRM [id number]. Topic: Referral - Request for Referral >> Aug 11, 2022 10:46 AM Clide Dales wrote: Has patient seen PCP for this complaint? No. *If NO, is insurance requiring patient see PCP for this issue before PCP can refer them? Referral for which specialty: Dentist Preferred provider/office:  Reason for referral:

## 2022-10-13 ENCOUNTER — Other Ambulatory Visit: Payer: Self-pay

## 2022-11-19 ENCOUNTER — Other Ambulatory Visit: Payer: Self-pay

## 2022-11-19 ENCOUNTER — Encounter: Payer: Self-pay | Admitting: Nurse Practitioner

## 2022-11-19 ENCOUNTER — Ambulatory Visit: Payer: Self-pay | Attending: Nurse Practitioner | Admitting: Nurse Practitioner

## 2022-11-19 VITALS — BP 129/79 | HR 77 | Ht 62.0 in | Wt 175.8 lb

## 2022-11-19 DIAGNOSIS — I10 Essential (primary) hypertension: Secondary | ICD-10-CM

## 2022-11-19 DIAGNOSIS — R7303 Prediabetes: Secondary | ICD-10-CM

## 2022-11-19 DIAGNOSIS — E785 Hyperlipidemia, unspecified: Secondary | ICD-10-CM

## 2022-11-19 DIAGNOSIS — Z1211 Encounter for screening for malignant neoplasm of colon: Secondary | ICD-10-CM

## 2022-11-19 DIAGNOSIS — Z1231 Encounter for screening mammogram for malignant neoplasm of breast: Secondary | ICD-10-CM

## 2022-11-19 DIAGNOSIS — E559 Vitamin D deficiency, unspecified: Secondary | ICD-10-CM

## 2022-11-19 DIAGNOSIS — R195 Other fecal abnormalities: Secondary | ICD-10-CM

## 2022-11-19 DIAGNOSIS — Z23 Encounter for immunization: Secondary | ICD-10-CM

## 2022-11-19 MED ORDER — LISINOPRIL 20 MG PO TABS
20.0000 mg | ORAL_TABLET | Freq: Every day | ORAL | 1 refills | Status: DC
  Filled 2022-11-19: qty 90, 90d supply, fill #0

## 2022-11-19 MED ORDER — AMLODIPINE BESYLATE 10 MG PO TABS
10.0000 mg | ORAL_TABLET | Freq: Every day | ORAL | 1 refills | Status: DC
  Filled 2022-11-19 – 2023-01-16 (×2): qty 90, 90d supply, fill #0
  Filled 2023-04-22: qty 90, 90d supply, fill #1

## 2022-11-19 MED ORDER — LISINOPRIL 10 MG PO TABS
10.0000 mg | ORAL_TABLET | Freq: Every day | ORAL | 3 refills | Status: DC
  Filled 2022-11-19 – 2023-01-16 (×2): qty 90, 90d supply, fill #0
  Filled 2023-04-22: qty 90, 90d supply, fill #1
  Filled 2023-07-20: qty 90, 90d supply, fill #2

## 2022-11-19 NOTE — Progress Notes (Signed)
Assessment & Plan:  Alexandra Jennings was seen today for medical management of chronic issues.  Diagnoses and all orders for this visit:  Primary hypertension Continue all antihypertensives as prescribed.  Reminded to bring in blood pressure log for follow  up appointment.  RECOMMENDATIONS: DASH/Mediterranean Diets are healthier choices for HTN.   -     CMP14+EGFR -     amLODipine (NORVASC) 10 MG tablet; Take 1 tablet (10 mg total) by mouth daily. -     Discontinue: lisinopril (ZESTRIL) 20 MG tablet; Take 1 tablet (20 mg total) by mouth daily. -     lisinopril (ZESTRIL) 10 MG tablet; Take 1 tablet (10 mg total) by mouth daily.  Prediabetes -     Hemoglobin A1c Continue blood sugar control as discussed in office today, low carbohydrate diet, and regular physical exercise as tolerated, 150 minutes per week (30 min each day, 5 days per week, or 50 min 3 days per week). Keep blood sugar logs with fasting goal of 90-130 mg/dl, post prandial (after you eat) less than 180.  For Hypoglycemia: BS <60 and Hyperglycemia BS >400; contact the clinic ASAP. Annual eye exams and foot exams are recommended.   Dyslipidemia, goal LDL below 70 -     Lipid panel INSTRUCTIONS: Work on a low fat, heart healthy diet and participate in regular aerobic exercise program by working out at least 150 minutes per week; 5 days a week-30 minutes per day. Avoid red meat/beef/steak,  fried foods. junk foods, sodas, sugary drinks, unhealthy snacking, alcohol and smoking.  Drink at least 80 oz of water per day and monitor your carbohydrate intake daily.    Colon cancer screening -     Ambulatory referral to Gastroenterology  Vitamin D deficiency disease -     VITAMIN D 25 Hydroxy (Vit-D Deficiency, Fractures)  Loose stools -     Ambulatory referral to Gastroenterology -     Giardia, EIA; Ova/Parasite  Breast cancer screening by mammogram -     MS 3D SCR MAMMO BILAT BR (aka MM); Future    Patient has been counseled on  age-appropriate routine health concerns for screening and prevention. These are reviewed and up-to-date. Referrals have been placed accordingly. Immunizations are up-to-date or declined.    Subjective:   Chief Complaint  Patient presents with   Medical Management of Chronic Issues   HPI Alexandra Jennings 62 y.o. female presents to office today for follow up to HTN  She has a past medical history of Hypertension and Prediabetes.   VRI was used to communicate directly with patient for the entire encounter including providing detailed patient instructions.     HTN She is taking lisinopril 10 mg and amlodipine 10 mg daily as prescribed.  Blood pressure is well-controlled. BP Readings from Last 3 Encounters:  11/19/22 129/79  06/05/22 (!) 148/85  04/25/22 (!) 141/89     GI She also endorses frequent loose stools. Last episode in which she soiled her underwear with stool was last week.  Loose stool episodes occurring several times per week. Onset of loose stools 3-4 years ago however this is the first time she is discussing this with me today. Her last colonoscopy was 2016 and she was instructed to return for repeat in 3 years. She has been lost to follow up.    Review of Systems  Constitutional:  Negative for fever, malaise/fatigue and weight loss.  HENT: Negative.  Negative for nosebleeds.   Eyes: Negative.  Negative for blurred  vision, double vision and photophobia.  Respiratory: Negative.  Negative for cough and shortness of breath.   Cardiovascular: Negative.  Negative for chest pain, palpitations and leg swelling.  Gastrointestinal:  Positive for diarrhea. Negative for abdominal pain, blood in stool, constipation, heartburn, melena, nausea and vomiting.  Musculoskeletal: Negative.  Negative for myalgias.  Neurological: Negative.  Negative for dizziness, focal weakness, seizures and headaches.  Psychiatric/Behavioral: Negative.  Negative for suicidal ideas.     Past Medical  History:  Diagnosis Date   Hypertension    Prediabetes     Past Surgical History:  Procedure Laterality Date   COLONOSCOPY N/A 01/30/2015   Procedure: COLONOSCOPY;  Surgeon: West Bali, MD;  [facility];  Service: Endoscopy;  Laterality: N/A;  1300    TUBAL LIGATION      Family History  Problem Relation Age of Onset   Cancer Mother        uterine   Cancer Father        prostate   Breast cancer Neg Hx     Social History Reviewed with no changes to be made today.   Outpatient Medications Prior to Visit  Medication Sig Dispense Refill   amLODipine (NORVASC) 10 MG tablet Take 1 tablet (10 mg total) by mouth daily. Please schedule PCP appointment. 90 tablet 1   lisinopril (ZESTRIL) 10 MG tablet Take 1 tablet (10 mg total) by mouth daily. Please schedule PCP appointment. 90 tablet 1   meloxicam (MOBIC) 7.5 MG tablet Take 1 tablet (7.5 mg total) by mouth daily as needed for pain (Patient not taking: Reported on 11/19/2022) 30 tablet 2   Vitamin D, Ergocalciferol, (DRISDOL) 1.25 MG (50000 UNIT) CAPS capsule Take 1 capsule (50,000 Units total) by mouth every 7 (seven) days. (Patient not taking: Reported on 11/19/2022) 16 capsule 0   No facility-administered medications prior to visit.    No Known Allergies     Objective:    BP 129/79   Pulse 77   Ht 5\' 2"  (1.575 m)   Wt 175 lb 12.8 oz (79.7 kg)   LMP 04/05/2011   SpO2 98%   BMI 32.15 kg/m  Wt Readings from Last 3 Encounters:  11/19/22 175 lb 12.8 oz (79.7 kg)  06/05/22 172 lb (78 kg)  10/02/21 172 lb 9.6 oz (78.3 kg)    Physical Exam Vitals and nursing note reviewed.  Constitutional:      Appearance: She is well-developed.  HENT:     Head: Normocephalic and atraumatic.  Cardiovascular:     Rate and Rhythm: Normal rate and regular rhythm.     Heart sounds: Normal heart sounds. No murmur heard.    No friction rub. No gallop.  Pulmonary:     Effort: Pulmonary effort is normal. No tachypnea or  respiratory distress.     Breath sounds: Normal breath sounds. No decreased breath sounds, wheezing, rhonchi or rales.  Chest:     Chest wall: No tenderness.  Abdominal:     General: Bowel sounds are normal.     Palpations: Abdomen is soft.  Musculoskeletal:        General: Normal range of motion.     Cervical back: Normal range of motion.  Skin:    General: Skin is warm and dry.  Neurological:     Mental Status: She is alert and oriented to person, place, and time.     Coordination: Coordination normal.  Psychiatric:        Behavior: Behavior normal.  Behavior is cooperative.        Thought Content: Thought content normal.        Judgment: Judgment normal.          Patient has been counseled extensively about nutrition and exercise as well as the importance of adherence with medications and regular follow-up. The patient was given clear instructions to go to ER or return to medical center if symptoms don't improve, worsen or new problems develop. The patient verbalized understanding.   Follow-up: Return for 4-6 weeks BP check with me angela and luke.   Claiborne Rigg, FNP-BC [facility] [place], [place] [phone]   11/19/2022, 12:42 PM

## 2022-11-19 NOTE — Progress Notes (Signed)
Interpreter [phone]

## 2022-11-20 ENCOUNTER — Other Ambulatory Visit: Payer: Self-pay

## 2022-11-20 LAB — CMP14+EGFR
ALT: 40 IU/L — ABNORMAL HIGH (ref 0–32)
AST: 30 IU/L (ref 0–40)
Albumin: 4.7 g/dL (ref 3.8–4.9)
Alkaline Phosphatase: 81 IU/L (ref 44–121)
BUN/Creatinine Ratio: 23 (ref 12–28)
BUN: 15 mg/dL (ref 8–27)
Bilirubin Total: 0.3 mg/dL (ref 0.0–1.2)
CO2: 22 mmol/L (ref 20–29)
Calcium: 9.7 mg/dL (ref 8.7–10.3)
Chloride: 104 mmol/L (ref 96–106)
Creatinine, Ser: 0.65 mg/dL (ref 0.57–1.00)
Globulin, Total: 3 g/dL (ref 1.5–4.5)
Glucose: 91 mg/dL (ref 70–99)
Potassium: 4.4 mmol/L (ref 3.5–5.2)
Sodium: 141 mmol/L (ref 134–144)
Total Protein: 7.7 g/dL (ref 6.0–8.5)
eGFR: 101 mL/min/{1.73_m2} (ref >59)

## 2022-11-20 LAB — LIPID PANEL
Chol/HDL Ratio: 4.4 ratio (ref 0.0–4.4)
Cholesterol, Total: 190 mg/dL (ref 100–199)
HDL: 43 mg/dL (ref >39)
LDL Chol Calc (NIH): 124 mg/dL — ABNORMAL HIGH (ref 0–99)
Triglycerides: 126 mg/dL (ref 0–149)
VLDL Cholesterol Cal: 23 mg/dL (ref 5–40)

## 2022-11-20 LAB — VITAMIN D 25 HYDROXY (VIT D DEFICIENCY, FRACTURES): Vit D, 25-Hydroxy: 31.1 ng/mL (ref 30.0–100.0)

## 2022-11-20 LAB — HEMOGLOBIN A1C
Est. average glucose Bld gHb Est-mCnc: 126 mg/dL
Hgb A1c MFr Bld: 6 % — ABNORMAL HIGH (ref 4.8–5.6)

## 2022-11-27 LAB — GIARDIA, EIA; OVA/PARASITE: Giardia Ag, Stl: NEGATIVE

## 2022-12-02 ENCOUNTER — Other Ambulatory Visit: Payer: Self-pay

## 2022-12-02 DIAGNOSIS — Z1231 Encounter for screening mammogram for malignant neoplasm of breast: Secondary | ICD-10-CM

## 2022-12-06 ENCOUNTER — Other Ambulatory Visit: Payer: Self-pay | Admitting: Nurse Practitioner

## 2022-12-06 ENCOUNTER — Other Ambulatory Visit: Payer: Self-pay

## 2022-12-06 DIAGNOSIS — A09 Infectious gastroenteritis and colitis, unspecified: Secondary | ICD-10-CM

## 2022-12-06 MED ORDER — METRONIDAZOLE 500 MG PO TABS
500.0000 mg | ORAL_TABLET | Freq: Three times a day (TID) | ORAL | 0 refills | Status: AC
  Filled 2022-12-06 – 2022-12-08 (×2): qty 30, 10d supply, fill #0

## 2022-12-08 ENCOUNTER — Other Ambulatory Visit: Payer: Self-pay

## 2022-12-25 ENCOUNTER — Ambulatory Visit: Payer: Self-pay | Admitting: Pharmacist

## 2023-01-12 ENCOUNTER — Telehealth: Payer: Self-pay | Admitting: Internal Medicine

## 2023-01-12 NOTE — Telephone Encounter (Signed)
Okay to schedule for direct colonoscopy for history of colon polyps. If patient's loose stools are significant, we could talk about them during an OV beforehand if the patient prefers.  Colonoscopy 01/30/15: 3 sessile polyps 5-7 mm removed with snare cautery. Mild diverticulosis in the sigmoid and descending colon. Redundant colon managed with abdominal counter-pressure. Excellent prep.  Path: Tubular adenomas

## 2023-01-12 NOTE — Telephone Encounter (Signed)
Hi Dr. Leonides Schanz,   Supervising Provider 01/12/2023 AM    We received a referral for patient for a colonoscopy and loose stool. Patient last had a colonoscopy in 2016 with [facility]. Patient is requesting a transfer of care due to calling previous provider's office to schedule and did not hear anything back. Patient also requesting to transfer care due to referral from primary care provider. Patient's previous reports can be found in EPIC under procedures tab. Please advise.    Thank you.

## 2023-01-13 ENCOUNTER — Encounter: Payer: Self-pay | Admitting: Internal Medicine

## 2023-01-16 ENCOUNTER — Other Ambulatory Visit: Payer: Self-pay

## 2023-01-19 ENCOUNTER — Other Ambulatory Visit: Payer: Self-pay

## 2023-01-22 ENCOUNTER — Ambulatory Visit: Payer: Self-pay | Admitting: Hematology and Oncology

## 2023-01-22 ENCOUNTER — Ambulatory Visit
Admission: RE | Admit: 2023-01-22 | Discharge: 2023-01-22 | Disposition: A | Discharge status: Home / Self Care | Payer: No Typology Code available for payment source | Source: Ambulatory Visit | Attending: Obstetrics and Gynecology | Admitting: Obstetrics and Gynecology

## 2023-01-22 VITALS — BP 153/76 | Wt 175.0 lb

## 2023-01-22 DIAGNOSIS — Z1231 Encounter for screening mammogram for malignant neoplasm of breast: Secondary | ICD-10-CM

## 2023-01-22 NOTE — Patient Instructions (Signed)
Taught Alexandra Jennings about self breast awareness and gave educational materials to take home. Patient did not need a Pap smear today due to last Pap smear was in 10/02/2021 per patient.  Let her know [facility] will cover Pap smears every 5 years unless has a history of abnormal Pap smears. [facility]erred [facility] to [facility] for screening mammogram. Appointment scheduled for 01/22/2023. Patient aware of appointment and will be there. Let patient know will follow up with her within the next couple weeks with results. Alexandra Jennings verbalized understanding.  Pascal Lux, NP 1:56 PM

## 2023-01-22 NOTE — Progress Notes (Signed)
[name] is a 62 y.o. female who presents to [facility] clinic today with no complaints.    Pap Smear: Pap not smear completed today. Last Pap smear was 10/02/2021 and was normal. Per patient has no history of an abnormal Pap smear. Last Pap smear result is available in Epic.   Physical exam: Breasts Breasts symmetrical. No skin abnormalities bilateral breasts. No nipple retraction bilateral breasts. No nipple discharge bilateral breasts. No lymphadenopathy. No lumps palpated bilateral breasts.   MS DIGITAL SCREENING TOMO BILATERAL  Result Date: 01/13/2022 CLINICAL DATA:  Screening. EXAM: DIGITAL SCREENING BILATERAL MAMMOGRAM WITH TOMOSYNTHESIS AND CAD TECHNIQUE: Bilateral screening digital craniocaudal and mediolateral oblique mammograms were obtained. Bilateral screening digital breast tomosynthesis was performed. The images were evaluated with computer-aided detection. COMPARISON:  Previous exam(s). ACR Breast Density Category b: There are scattered areas of fibroglandular density. FINDINGS: There are no findings suspicious for malignancy. IMPRESSION: No mammographic evidence of malignancy. A result letter of this screening mammogram will be mailed directly to the patient. RECOMMENDATION: Screening mammogram in one year. (Code:SM-B-01Y) BI-RADS CATEGORY  1: Negative. Electronically Signed   By: Baird Lyons M.D.   On: 01/13/2022 15:40   MS DIGITAL SCREENING TOMO BILATERAL  Result Date: 02/04/2020 CLINICAL DATA:  Screening. EXAM: DIGITAL SCREENING BILATERAL MAMMOGRAM WITH TOMO AND CAD COMPARISON:  Previous exam(s). ACR Breast Density Category b: There are scattered areas of fibroglandular density. FINDINGS: There are no findings suspicious for malignancy. Images were processed with CAD. IMPRESSION: No mammographic evidence of malignancy. A result letter of this screening mammogram will be mailed directly to the patient. RECOMMENDATION: Screening mammogram in one year. (Code:SM-B-01Y)  BI-RADS CATEGORY  1: Negative. Electronically Signed   By: Norva Pavlov M.D.   On: 02/04/2020 11:25   MS DIGITAL SCREENING TOMO BILATERAL  Result Date: 12/08/2018 CLINICAL DATA:  Screening. EXAM: DIGITAL SCREENING BILATERAL MAMMOGRAM WITH TOMO AND CAD COMPARISON:  Previous exam(s). ACR Breast Density Category b: There are scattered areas of fibroglandular density. FINDINGS: There are no findings suspicious for malignancy. Images were processed with CAD. IMPRESSION: No mammographic evidence of malignancy. A result letter of this screening mammogram will be mailed directly to the patient. RECOMMENDATION: Screening mammogram in one year. (Code:SM-B-01Y) BI-RADS CATEGORY  1: Negative. Electronically Signed   By: Sande Brothers M.D.   On: 12/08/2018 08:12        Pelvic/Bimanual Pap is not indicated today    Smoking History: Patient has never smoked and was not referred to quit line.    Patient Navigation: Patient education provided. Access to services provided for patient through [facility] program. No interpreter provided. No transportation provided   Colorectal Cancer Screening: Per patient has never had colonoscopy completed No complaints today. FIT test given.   Breast and Cervical Cancer Risk Assessment: Patient does not have family history of breast cancer, known genetic mutations, or radiation treatment to the chest before age 80. Patient does not have history of cervical dysplasia, immunocompromised, or DES exposure in-utero.  Risk Scores as of Encounter on 01/22/2023     Alexandra Jennings           5-year 0.59%   Lifetime 3.41%            Last calculated by Caprice Red, CMA on 01/22/2023 at  1:57 PM          A: B[facility]CP exam without pap smear No complaints with benign exam.   P: Referred [facility] to [facility] for a screening  mammogram. Appointment scheduled 01/22/2023.  Pascal Lux, NP 01/22/2023 1:54 PM

## 2023-02-13 ENCOUNTER — Other Ambulatory Visit: Payer: Self-pay

## 2023-02-13 ENCOUNTER — Ambulatory Visit: Payer: Self-pay

## 2023-02-13 VITALS — Ht 62.0 in | Wt 172.0 lb

## 2023-02-13 DIAGNOSIS — Z1211 Encounter for screening for malignant neoplasm of colon: Secondary | ICD-10-CM

## 2023-02-13 MED ORDER — NA SULFATE-K SULFATE-MG SULF 17.5-3.13-1.6 GM/177ML PO SOLN
1.0000 | Freq: Once | ORAL | 0 refills | Status: AC
  Filled 2023-02-13: qty 354, 2d supply, fill #0

## 2023-02-13 NOTE — Progress Notes (Signed)
Denies allergies to eggs or soy products. Denies complication of anesthesia or sedation. Denies use of weight loss medication. Denies use of O2.   Emmi instructions given for colonoscopy.    Pre-Visit was conducted with [facility] : [facility] the interpreter for the patient.

## 2023-02-16 ENCOUNTER — Other Ambulatory Visit: Payer: Self-pay

## 2023-02-27 ENCOUNTER — Ambulatory Visit: Payer: Self-pay | Admitting: Nurse Practitioner

## 2023-03-03 ENCOUNTER — Ambulatory Visit (AMBULATORY_SURGERY_CENTER): Payer: Self-pay | Admitting: Internal Medicine

## 2023-03-03 ENCOUNTER — Encounter: Payer: Self-pay | Admitting: Internal Medicine

## 2023-03-03 VITALS — BP 107/70 | HR 82 | Temp 98.0°F | Resp 17 | Ht 62.0 in | Wt 172.0 lb

## 2023-03-03 DIAGNOSIS — Z1211 Encounter for screening for malignant neoplasm of colon: Secondary | ICD-10-CM

## 2023-03-03 DIAGNOSIS — D125 Benign neoplasm of sigmoid colon: Secondary | ICD-10-CM

## 2023-03-03 DIAGNOSIS — D123 Benign neoplasm of transverse colon: Secondary | ICD-10-CM

## 2023-03-03 DIAGNOSIS — Z860101 Personal history of adenomatous and serrated colon polyps: Secondary | ICD-10-CM

## 2023-03-03 DIAGNOSIS — K5730 Diverticulosis of large intestine without perforation or abscess without bleeding: Secondary | ICD-10-CM

## 2023-03-03 DIAGNOSIS — K648 Other hemorrhoids: Secondary | ICD-10-CM

## 2023-03-03 DIAGNOSIS — D122 Benign neoplasm of ascending colon: Secondary | ICD-10-CM

## 2023-03-03 MED ORDER — SODIUM CHLORIDE 0.9 % IV SOLN: 500.0000 mL | Freq: Once | INTRAVENOUS | Status: DC

## 2023-03-03 NOTE — Patient Instructions (Addendum)
 Suplemento de fibra diaria - Benefiber   USTED TUVO UN PROCEDIMIENTO ENDOSCPICO HOY EN EL [facility] [facility]:   Lea el informe del procedimiento que se le entreg para cualquier pregunta especfica sobre lo que se dentist.  Si el informe del examen no responde a sus preguntas, por favor llame a su gastroenterlogo para aclararlo.  Si usted solicit que no se le den lowe's companies de lo que se clinical cytogeneticist en su procedimiento al marathon oil va a cuidar, entonces el informe del procedimiento se ha incluido en un sobre sellado para que usted lo revise despus cuando le sea ms conveniente.   LO QUE PUEDE ESPERAR: Algunas sensaciones de hinchazn en el abdomen.  Puede tener ms gases de lo normal.  El caminar puede ayudarle a eliminar el aire que se le puso en el tracto gastrointestinal durante el procedimiento y reducir la hinchazn.  Si le hicieron una endoscopia inferior (como una colonoscopia o una sigmoidoscopia flexible), podra notar manchas de sangre en las heces fecales o en el papel higinico.  Si se someti a una preparacin intestinal para su procedimiento, es posible que no tenga una evacuacin intestinal normal durante time warner.   Tenga en cuenta:  Es posible que note un poco de irritacin y congestin en la nariz o algn drenaje.  Esto es debido al oxgeno applied materials durante su procedimiento.  No hay que preocuparse y esto debe desaparecer ms o regulatory affairs officer.   SNTOMAS PARA REPORTAR INMEDIATAMENTE:  Despus de una endoscopia inferior (colonoscopia o sigmoidoscopia flexible):  Cantidades excesivas de sangre en las heces fecales  Sensibilidad significativa o empeoramiento de los dolores abdominales   Hinchazn aguda del abdomen que antes no tena   Fiebre de 100F o ms     Para asuntos urgentes o de associate professor, puede comunicarse con un gastroenterlogo a cualquier hora llamando al [phone].  DIETA:  Recomendamos una comida pequea al principio, pero  luego puede continuar con su dieta normal.  Tome muchos lquidos, pero debe evitar las bebidas alcohlicas durante 24 horas.    ACTIVIDAD:  Debe planear tomarse las cosas con calma por el resto del da y no debe CONDUCIR ni usar maquinaria pesada patent examiner (debido a los medicamentos de sedacin utilizados durante el examen).     SEGUIMIENTO: Nuestro personal llamar al nmero que aparece en su historial al siguiente da hbil de su procedimiento para ver cmo se siente y para responder cualquier pregunta o inquietud que pueda tener con respecto a la informacin que se le dio despus del procedimiento. Si no podemos contactarle, le dejaremos un mensaje.  Sin embargo, si se siente bien y no tiene english as a second language teacher, no es necesario que nos devuelva la llamada.  Asumiremos que ha regresado a sus actividades diarias normales sin incidentes. Si se le tomaron algunas biopsias, le contactaremos por telfono o por carta en las prximas 3 semanas.  Si no ha sabido [employer] biopsias en el transcurso de 3 semanas, por favor llmenos al [phone].   FIRMAS/CONFIDENCIALIDAD: Usted y/o el acompaante que le cuide han firmado documentos que se ingresarn en su historial mdico forensic scientist.  Estas firmas atestiguan el hecho de que la informacin anterior USTED TUVO UN PROCEDIMIENTO ENDOSCPICO HOY EN EL [facility]:   Lea el informe del procedimiento que se le entreg para cualquier pregunta especfica sobre lo que se dentist.  Si el informe del examen no responde a sus preguntas, por favor  llame a su gastroenterlogo para aclararlo.  Si usted solicit que no se le den lowe's companies de lo que se clinical cytogeneticist en su procedimiento al marathon oil va a cuidar, entonces el informe del procedimiento se ha incluido en un sobre sellado para que usted lo revise despus cuando le sea ms conveniente.   LO QUE PUEDE ESPERAR: Algunas sensaciones de hinchazn en el abdomen.  Puede tener ms  gases de lo normal.  El caminar puede ayudarle a eliminar el aire que se le puso en el tracto gastrointestinal durante el procedimiento y reducir la hinchazn.  Si le hicieron una endoscopia inferior (como una colonoscopia o una sigmoidoscopia flexible), podra notar manchas de sangre en las heces fecales o en el papel higinico.  Si se someti a una preparacin intestinal para su procedimiento, es posible que no tenga una evacuacin intestinal normal durante time warner.   Tenga en cuenta:  Es posible que note un poco de irritacin y congestin en la nariz o algn drenaje.  Esto es debido al oxgeno applied materials durante su procedimiento.  No hay que preocuparse y esto debe desaparecer ms o regulatory affairs officer.   SNTOMAS PARA REPORTAR INMEDIATAMENTE:  Despus de una endoscopia inferior (colonoscopia o sigmoidoscopia flexible):  Cantidades excesivas de sangre en las heces fecales  Sensibilidad significativa o empeoramiento de los dolores abdominales   Hinchazn aguda del abdomen que antes no tena   Fiebre de 100F o ms   Despus de la endoscopia superior (EGD)  Vmitos de retail buyer o material como caf molido   Dolor en el pecho o dolor debajo de los omplatos que antes no tena   Dolor o dificultad persistente para tragar  Falta de aire que antes no tena   Fiebre de 100F o ms  Heces fecales negras y pegajosas   Para asuntos urgentes o de associate professor, puede comunicarse con un gastroenterlogo a cualquier hora llamando al [phone].  DIETA:  Recomendamos una comida pequea al principio, pero luego puede continuar con su dieta normal.  Tome muchos lquidos, pero debe evitar las bebidas alcohlicas durante 24 horas.    ACTIVIDAD:  Debe planear tomarse las cosas con calma por el resto del da y no debe CONDUCIR ni usar maquinaria pesada patent examiner (debido a los medicamentos de sedacin utilizados durante el examen).     SEGUIMIENTO: Nuestro personal llamar al nmero que aparece en su  historial al siguiente da hbil de su procedimiento para ver cmo se siente y para responder cualquier pregunta o inquietud que pueda tener con respecto a la informacin que se le dio despus del procedimiento. Si no podemos contactarle, le dejaremos un mensaje.  Sin embargo, si se siente bien y no tiene english as a second language teacher, no es necesario que nos devuelva la llamada.  Asumiremos que ha regresado a sus actividades diarias normales sin incidentes. Si se le tomaron algunas biopsias, le contactaremos por telfono o por carta en las prximas 3 semanas.  Si no ha sabido [employer] biopsias en el transcurso de 3 semanas, por favor llmenos al [phone].   FIRMAS/CONFIDENCIALIDAD: Usted y/o el acompaante que le cuide han firmado documentos que se ingresarn en su historial mdico electrnico.  Estas firmas atestiguan el hecho de que la informacin anterior

## 2023-03-03 NOTE — Progress Notes (Signed)
Called to room to assist during endoscopic procedure.  Patient ID and intended procedure confirmed with present staff. Received instructions for my participation in the procedure from the performing physician.  

## 2023-03-03 NOTE — Op Note (Signed)
 [facility] Patient Name: Alexandra Jennings [facility] Procedure Date: 03/03/2023 10:30 AM MRN: [id number] Endoscopist: Rosario Estefana Kidney , , [id number] Age: 62 Referring MD:  Date of Birth: May 04, 1960 Gender: Female Account #: [id number] Procedure:                Colonoscopy Indications:              High risk colon cancer surveillance: Personal                            history of colonic polyps Medicines:                Monitored Anesthesia Care Procedure:                Pre-Anesthesia Assessment:                           - Prior to the procedure, a History and Physical                            was performed, and patient medications and                            allergies were reviewed. The patient's tolerance of                            previous anesthesia was also reviewed. The risks                            and benefits of the procedure and the sedation                            options and risks were discussed with the patient.                            All questions were answered, and informed consent                            was obtained. Prior Anticoagulants: The patient has                            taken no anticoagulant or antiplatelet agents. ASA                            Grade Assessment: II - A patient with mild systemic                            disease. After reviewing the risks and benefits,                            the patient was deemed in satisfactory condition to                            undergo the procedure.  After obtaining informed consent, the colonoscope                            was passed under direct vision. Throughout the                            procedure, the patient's blood pressure, pulse, and                            oxygen saturations were monitored continuously. The                            CF [id number] #[id number] was introduced through the anus                            and advanced to the  the terminal ileum. The                            colonoscopy was performed without difficulty. The                            patient tolerated the procedure well. The quality                            of the bowel preparation was good. The terminal                            ileum, ileocecal valve, appendiceal orifice, and                            rectum were photographed. Scope In: 10:38:57 AM Scope Out: 10:58:20 AM Scope Withdrawal Time: 0 hours 13 minutes 12 seconds  Total Procedure Duration: 0 hours 19 minutes 23 seconds  Findings:                 The terminal ileum appeared normal.                           Multiple diverticula were found in the sigmoid                            colon and descending colon.                           Three sessile polyps were found in the transverse                            colon and ascending colon. The polyps were 3 to 6                            mm in size. These polyps were removed with a cold                            snare. Resection and retrieval were complete.  A 7 mm polyp was found in the sigmoid colon. The                            polyp was sessile. The polyp was removed with a                            cold snare. Resection and retrieval were complete.                           Non-bleeding internal hemorrhoids were found during                            retroflexion. Complications:            No immediate complications. Estimated Blood Loss:     Estimated blood loss was minimal. Impression:               - The examined portion of the ileum was normal.                           - Diverticulosis in the sigmoid colon and in the                            descending colon.                           - Three 3 to 6 mm polyps in the transverse colon                            and in the ascending colon, removed with a cold                            snare. Resected and retrieved.                            - One 7 mm polyp in the sigmoid colon, removed with                            a cold snare. Resected and retrieved.                           - Non-bleeding internal hemorrhoids. Recommendation:           - Discharge patient to home (with escort).                           - Await pathology results.                           - Start daily dose of Benefiber.                           - Low FODMAP diet.                           - If fecal  incontinence persists, then please                            contact us  to schedule a follow up clinic                            appointment.                           - The findings and recommendations were discussed                            with the patient. Dr Estefana Federico Rosario Estefana Federico,  03/03/2023 11:03:10 AM

## 2023-03-03 NOTE — Progress Notes (Signed)
 Sedate, gd SR, tolerated procedure well, VSS, report to RN

## 2023-03-03 NOTE — Progress Notes (Signed)
 Interpreter used today at the [facility] for this pt.  Interpreter's name is- Raquel   Pt's states no medical or surgical changes since previsit or office visit.

## 2023-03-03 NOTE — Progress Notes (Signed)
 GASTROENTEROLOGY PROCEDURE H&P NOTE   Primary Care Physician: Theotis Haze ORN, NP    Reason for Procedure:   History of colon polyps  Plan:    Colonoscopy  Patient is appropriate for endoscopic procedure(s) in the ambulatory ([facility]) setting.  The nature of the procedure, as well as the risks, benefits, and alternatives were carefully and thoroughly reviewed with the patient. Ample time for discussion and questions allowed. The patient understood, was satisfied, and agreed to proceed.     HPI: Alexandra Jennings is a 62 y.o. female who presents for colonoscopy for history of colon polyps.  Denies blood in stools or unintentional weight loss. Denies family history of colon cancer. She does have fecal incontinence that sometimes occurs right after she has a BM. She has on average 1 BM per day. Denies diarrhea or constipation.  Colonoscopy 01/30/15: 3 sessile polyps 5-7 mm removed with snare cautery. Mild diverticulosis in the sigmoid and descending colon. Redundant colon managed with abdominal counter-pressure. Excellent prep.  Path: Tubular adenomas  Past Medical History:  Diagnosis Date   Allergy    Cataract    GERD (gastroesophageal reflux disease)    Hypertension    Prediabetes     Past Surgical History:  Procedure Laterality Date   COLONOSCOPY N/A 01/30/2015   Procedure: COLONOSCOPY;  Surgeon: Margo LITTIE Haddock, MD;  Location: [facility];  Service: Endoscopy;  Laterality: N/A;  1300    TUBAL LIGATION      Prior to Admission medications   Medication Sig Start Date End Date Taking? Authorizing Provider  amLODipine  (NORVASC ) 10 MG tablet Take 1 tablet (10 mg total) by mouth daily. 11/19/22  Yes Fleming, Zelda W, NP  lisinopril  (ZESTRIL ) 10 MG tablet Take 1 tablet (10 mg total) by mouth daily. 11/19/22  Yes Theotis Haze ORN, NP    Current Outpatient Medications  Medication Sig Dispense Refill   amLODipine  (NORVASC ) 10 MG tablet Take 1 tablet (10 mg total) by mouth  daily. 90 tablet 1   lisinopril  (ZESTRIL ) 10 MG tablet Take 1 tablet (10 mg total) by mouth daily. 90 tablet 3   Current Facility-Administered Medications  Medication Dose Route Frequency Provider Last Rate Last Admin   0.9 %  sodium chloride  infusion  500 mL Intravenous Once Federico Rosario BROCKS, MD        Allergies as of 03/03/2023   (No Known Allergies)    Family History  Problem Relation Age of Onset   Cancer Mother        uterine   Cancer Father        prostate   Breast cancer Neg Hx    Colon cancer Neg Hx    Esophageal cancer Neg Hx    Rectal cancer Neg Hx    Stomach cancer Neg Hx     Social History   Socioeconomic History   Marital status: Divorced    Spouse name: Not on file   Number of children: 3   Years of education: Not on file   Highest education level: 8th grade  Occupational History   Not on file  Tobacco Use   Smoking status: Never   Smokeless tobacco: Never  Vaping Use   Vaping status: Never Used  Substance and Sexual Activity   Alcohol use: Never   Drug use: Never   Sexual activity: Not Currently    Birth control/protection: Other-see comments    Comment: Tubal ligation  Other Topics Concern   Not on file  Social  History Narrative   ** Merged History Encounter **       Social Drivers of Corporate Investment Banker Strain: Not on file  Food Insecurity: Food Insecurity Present (01/22/2023)   Hunger Vital Sign    Worried About Running Out of Food in the Last Year: Sometimes true    Ran Out of Food in the Last Year: Never true  Transportation Needs: No Transportation Needs (01/22/2023)   PRAPARE - Administrator, Civil Service (Medical): No    Lack of Transportation (Non-Medical): No  Physical Activity: Not on file  Stress: Not on file  Social Connections: Not on file  Intimate Partner Violence: Not At Risk (04/25/2022)   Humiliation, Afraid, Rape, and Kick questionnaire    Fear of Current or Ex-Partner: No    Emotionally Abused:  No    Physically Abused: No    Sexually Abused: No    Physical Exam: Vital signs in last 24 hours: BP 131/71   Pulse 82   Temp 98 F (36.7 C)   Ht 5' 2 (1.575 m)   Wt 172 lb (78 kg)   LMP 04/05/2011   SpO2 99%   BMI 31.46 kg/m  GEN: NAD EYE: Sclerae anicteric ENT: MMM CV: Non-tachycardic Pulm: No increased work of breathing GI: Soft, NT/ND NEURO:  Alert & Oriented   Estefana Kidney, MD [facility]  03/03/2023 10:27 AM

## 2023-03-06 ENCOUNTER — Telehealth: Payer: Self-pay

## 2023-03-06 NOTE — Telephone Encounter (Signed)
  Follow up Call-     03/03/2023    8:47 AM  Call back number  Post procedure Call Back phone  # [phone]  Permission to leave phone message Yes     Patient questions:  Do you have a fever, pain , or abdominal swelling? No. Pain Score  0 *  Have you tolerated food without any problems? Yes.    Have you been able to return to your normal activities? Yes.    Do you have any questions about your discharge instructions: Diet   No. Medications  No. Follow up visit  No.  Do you have questions or concerns about your Care? No.  Actions: * If pain score is 4 or above: No action needed, pain <4.

## 2023-03-09 LAB — SURGICAL PATHOLOGY

## 2023-03-10 ENCOUNTER — Encounter: Payer: Self-pay | Admitting: Internal Medicine

## 2023-03-25 ENCOUNTER — Ambulatory Visit: Payer: No Typology Code available for payment source | Attending: Physician Assistant | Admitting: Physician Assistant

## 2023-03-25 VITALS — BP 128/84 | HR 76 | Wt 171.0 lb

## 2023-03-25 DIAGNOSIS — I10 Essential (primary) hypertension: Secondary | ICD-10-CM

## 2023-03-25 DIAGNOSIS — Z758 Other problems related to medical facilities and other health care: Secondary | ICD-10-CM

## 2023-03-25 DIAGNOSIS — R7303 Prediabetes: Secondary | ICD-10-CM

## 2023-03-25 DIAGNOSIS — Z603 Acculturation difficulty: Secondary | ICD-10-CM

## 2023-03-25 DIAGNOSIS — R159 Full incontinence of feces: Secondary | ICD-10-CM

## 2023-03-25 NOTE — Progress Notes (Signed)
[place] [phone]

## 2023-03-25 NOTE — Progress Notes (Signed)
Patient ID: Alexandra Jennings, female   DOB: January 10, 1961, 63 y.o.   MRN: [id number]   Alexandra Jennings, is a 63 y.o. female  [id number]  [id number]  DOB - 02-23-1961  Chief Complaint  Patient presents with   Hypertension       Subjective:   Alexandra Jennings is a 63 y.o. female here today for BP check.  She continues having problems with fecal incontinence.  She goes to the bathroom and has a BM then about 15 mins later will have an accident of stool.  She was seen by GI and colonoscopy performed.  They recommended increased fiber and if it continued to follow up here.  Appetite is good.  No N/V.  No melena or hematochezia.    Occasional urinary incontinence but says that is very mild.     No problems updated.  ALLERGIES: No Known Allergies  PAST MEDICAL HISTORY: Past Medical History:  Diagnosis Date   Allergy    Cataract    GERD (gastroesophageal reflux disease)    Hypertension    Prediabetes     MEDICATIONS AT HOME: Prior to Admission medications   Medication Sig Start Date End Date Taking? Authorizing Provider  amLODipine (NORVASC) 10 MG tablet Take 1 tablet (10 mg total) by mouth daily. 11/19/22  Yes Claiborne Rigg, NP  lisinopril (ZESTRIL) 10 MG tablet Take 1 tablet (10 mg total) by mouth daily. 11/19/22  Yes Claiborne Rigg, NP    ROS: Neg HEENT Neg resp Neg cardiac Neg GU Neg MS Neg psych Neg neuro  Objective:   Vitals:   03/25/23 1004  BP: 128/84  Pulse: 76  SpO2: 100%  Weight: 171 lb (77.6 kg)   Exam General appearance : Awake, alert, not in any distress. Speech Clear. Not toxic looking HEENT: Atraumatic and Normocephalic, pupils equally reactive to light and accomodation Neck: Supple, no JVD. No cervical lymphadenopathy.  Chest: Good air entry bilaterally, CTAB.  No rales/rhonchi/wheezing CVS: S1 S2 regular, no murmurs.  Abdomen: Bowel sounds present, Non tender and not distended with no gaurding, rigidity or  rebound. Extremities: B/L Lower Ext shows no edema, both legs are warm to touch Neurology: Awake alert, and oriented X 3, CN II-XII intact, Non focal Skin: No Rash  Data Review Lab Results  Component Value Date   HGBA1C 6.0 (H) 11/19/2022   HGBA1C 5.7 (H) 10/02/2021   HGBA1C 5.8 (H) 04/30/2021    Assessment & Plan   1. Incontinence of feces, unspecified fecal incontinence type (Primary) Seen by GI already - Ambulatory referral to Colorectal Surgery  2. Prediabetes Work at a goal of eliminating sugary drinks, candy, desserts, sweets, refined sugars, processed foods, and white carbohydrates.   - Hemoglobin A1c  3. Primary hypertension Controlled on amlodipine 10 and lisinopril 10(appears as though she has plenty RF) - Basic Metabolic Panel  4. Language barrier AMN interpreters used and additional time performing visit was required.     Return in about 6 months (around 09/22/2023) for PCP for chronic conditions-[place].  The patient was given clear instructions to go to ER or return to medical center if symptoms don't improve, worsen or new problems develop. The patient verbalized understanding. The patient was told to call to get lab results if they haven't heard anything in the next week.      Georgian Co, PA-C [facility] [place], [place] [phone]   03/25/2023, 10:32 AM

## 2023-03-25 NOTE — Patient Instructions (Signed)
Incontinencia fecal Fecal Incontinence La incontinencia fecal, tambin llamada escape intestinal accidental, es la incapacidad de [employer] intestinos. Esto significa que hay fugas de heces (materia fecal) del recto. Esta afeccin ocurre porque los nervios o los msculos que rodean el ano no cumplen su funcin como es debido. El ano es la abertura que est entre las nalgas. Cules son las causas? Esta afeccin puede ser causada por lo siguiente: Gard[place] Ramus a los msculos que se encuentran al final del recto (esfnter). Dao a los nervios que controlan las deposiciones. Diarrea. Estreimiento crnico. Disfuncin del suelo plvico. Esto significa que los msculos de la pelvis no cumplen su funcin correctamente. Prdida de la capacidad de almacenamiento del intestino. Esto ocurre cuando el recto ya no puede estirarse para dar Environmental consultant a las heces. Enfermedad inflamatoria del intestino (EII), como la enfermedad de Crohn. Sndrome de colon irritable (SCI). Qu incrementa el riesgo? Es ms probable que desarrolle esta afeccin si: Naci con un defecto en la formacin de los intestinos o la pelvis. Se someti a una ciruga del recto. Se someti a radioterapia para tratar determinados tipos de cncer. Ha estado [place], tuvo un parto vaginal o se someti a Armed forces technical officer caus daos en los msculos del suelo plvico. Tiene un dao nervioso que afecta el recto o el ano: Tuvo un parto complicado, una lesin en la mdula espinal u otro traumatismo que le haya producido un dao nervioso. Tiene un trastorno que puede afectar la funcin nerviosa, como diabetes, enfermedad de Parkinson o esclerosis mltiple (EM). Tiene una enfermedad en la cual el recto sale a travs del ano o de la vagina (prolapso). Tiene 65 aos o ms. Cules son los signos o sntomas? El sntoma principal de esta afeccin es la incapacidad de Chief Operating Officer los intestinos. Adems, es posible que no logre llegar al bao antes de  defecar. Otros sntomas pueden incluir: Diarrea. Estreimiento. Molestias abdominales. Cmo se diagnostica? Esta afeccin se diagnostica mediante una revisin de los antecedentes mdicos y un examen fsico. Tambin pueden hacerle otras pruebas, incluidas las siguientes: [place] y [place]. Examen rectal. Neomia Dear ecografa o una resonancia magntica (RM). Defecografa. En esta prueba, se [place] un lquido especial para ver cmo funcionan el recto y el ano durante la deposicin. Colonoscopa. Este estudio permite examinar el intestino grueso (colon). Manometra anal. Este estudio mide la fuerza de contraccin del esfnter anal. Electromiografa (EMG) anal. En este estudio se utilizan pequeos electrodos para determinar si existe dao nervioso. Cmo se trata? El tratamiento de esta afeccin depende de su causa y su gravedad. El tratamiento tambin puede centrarse en el abordaje de cualquier causa preexistente de esta afeccin. El tratamiento puede incluir: Medicamentos como, por ejemplo, para: Human resources officer. Ayudar con el estreimiento (laxantes formadores de masa). Tratar las afecciones preexistentes. Terapia de biorretroalimentacin. Esta puede ayudar a volver a Theatre manager que estn afectados. Suplementos de Emma. Estos pueden ayudar a Radiation protection practitioner. Estimulacin nerviosa. Un gel inyectable para promover el crecimiento del tejido y Customer service manager. Ciruga. Puede ser necesario: Ciruga para Warehouse manager. Ciruga de derivacin. Este procedimiento permite la salida de las heces del organismo a travs de un orificio en el abdomen. Siga estas instrucciones en su casa: Comida y bebida  Siga las instrucciones del mdico respecto de lo que puede comer y Product manager. Trabaje con un nutricionista para que lo ayude a [employer] y bebidas que pueden agravar su afeccin. Estos incluyen: Comidas condimentadas. Alimentos grasosos o  aceitosos. Carne [place]. Productos lcteos. Bebidas con cafena. Lleve un diario de su dieta para determinar qu alimentos o bebidas podran estar agravando su afeccin. No beba alcohol. Beba suficiente lquido como para Pharmacologist la orina de color amarillo plido. Estilo de vida No consuma ningn producto que contenga nicotina o tabaco. Estos productos incluyen cigarrillos, tabaco para Theatre manager y aparatos de vapeo, como los Administrator, Civil Service. Si necesita ayuda para dejar de fumar, consulte al mdico. Si tiene sobrepeso, hable con el mdico sobre cmo puede adelgazar de [place] segura. Esto puede Insurance risk surveyor. Aumente la actividad fsica como se lo haya indicado el mdico. Esto puede aliviar la afeccin. Hable con el mdico antes de comenzar un programa nuevo de [place] fsica. Lleve consigo una muda de ropa y suministros para asearse rpidamente si tiene un episodio de incontinencia fecal. Considere la posibilidad de Advertising account planner en un grupo de apoyo para la incontinencia fecal. Puede encontrar un grupo de apoyo en Internet o en su comunidad local. Instrucciones generales  Use los medicamentos de venta libre y los recetados solamente como se lo haya indicado el mdico. Esto incluye todos los suplementos. Aplquese una crema de barrera contra la humedad, como vaselina, en el ano. Esto protege la piel y evita la irritacin causada por el escape o la diarrea permanentes. Infrmele al mdico si est molesto o deprimido debido a la afeccin. Dnde buscar ms informacin Arts development officer for Functional Gastrointestinal Disorders (Fundacin Internacional para los Trastornos Gastrointestinales Funcionales): iffgd.org [employer] of Gastroenterology (Colegio Estadounidense de Cytogeneticist): patients.gi.org Comunquese con un mdico si: Tiene fiebre. Tiene enrojecimiento, hinchazn o dolor alrededor del ano. El dolor empeora o pierde la sensibilidad en al rea  rectal. Observa sangre en las heces. Se siente triste o desesperanzado. Evita las situaciones sociales o laborales. Solicite ayuda de inmediato si: Deja de defecar. No puede comer ni beber sin vomitar. Tiene hemorragia rectal que no se detiene. Siente un dolor intenso que [place]. Tiene sntomas de deshidratacin, entre ellos: Somnolencia o cansancio (fatiga). Escasa produccin o ausencia de orina, lgrimas o sudor. Mareos. Sequedad de boca. Irritabilidad atpica. Dolor de [place]. Imposibilidad de pensar con claridad. Resumen La incontinencia fecal, tambin llamada escape intestinal accidental, es la incapacidad de [employer] intestinos. Esta afeccin ocurre porque los nervios o los msculos que rodean el ano no cumplen su funcin como es debido. El tratamiento depende de la causa y la gravedad de los sntomas. Siga las instrucciones del mdico respecto de lo que puede comer y beber, Government social research officer en el estilo de vida y el cuidado de la piel. Use los medicamentos de venta libre y los recetados solamente como se lo haya indicado el mdico. Infrmele al mdico si los sntomas empeoran o si est molesto o deprimido debido a Astronomer. Esta informacin no tiene Theme park manager el consejo del mdico. Asegrese de hacerle al mdico cualquier pregunta que tenga. Document Revised: 06/11/2021 Document Reviewed: 06/11/2021 Elsevier Patient Education  2024 [employer].

## 2023-03-26 ENCOUNTER — Encounter: Payer: Self-pay | Admitting: Physician Assistant

## 2023-03-26 LAB — BASIC METABOLIC PANEL
BUN/Creatinine Ratio: 20 (ref 12–28)
BUN: 13 mg/dL (ref 8–27)
CO2: 21 mmol/L (ref 20–29)
Calcium: 10.1 mg/dL (ref 8.7–10.3)
Chloride: 105 mmol/L (ref 96–106)
Creatinine, Ser: 0.66 mg/dL (ref 0.57–1.00)
Glucose: 87 mg/dL (ref 70–99)
Potassium: 4.8 mmol/L (ref 3.5–5.2)
Sodium: 143 mmol/L (ref 134–144)
eGFR: 100 mL/min/{1.73_m2} (ref >59)

## 2023-03-26 LAB — HEMOGLOBIN A1C
Est. average glucose Bld gHb Est-mCnc: 120 mg/dL
Hgb A1c MFr Bld: 5.8 % — ABNORMAL HIGH (ref 4.8–5.6)

## 2023-03-27 ENCOUNTER — Telehealth: Payer: Self-pay

## 2023-03-27 NOTE — Telephone Encounter (Signed)
Pt was called and is aware of results, DOB was confirmed.   Interpreter id # [phone]

## 2023-03-27 NOTE — Telephone Encounter (Signed)
-----   Message from Georgian Co sent at 03/26/2023  8:33 AM EST ----- Please call patient-Prediabetes has improved a little.  Continue to work at a goal of eliminating sugary drinks, candy, desserts, sweets, refined sugars, processed foods, and white carbohydrates. Kidney function and electrolytes are normal.  Thanks, Georgian Co, PA-C

## 2023-04-22 ENCOUNTER — Ambulatory Visit: Payer: Self-pay | Admitting: Dermatology

## 2023-04-22 ENCOUNTER — Other Ambulatory Visit: Payer: Self-pay

## 2023-04-22 ENCOUNTER — Encounter: Payer: Self-pay | Admitting: Dermatology

## 2023-04-22 VITALS — BP 151/86

## 2023-04-22 DIAGNOSIS — D485 Neoplasm of uncertain behavior of skin: Secondary | ICD-10-CM

## 2023-04-22 DIAGNOSIS — L820 Inflamed seborrheic keratosis: Secondary | ICD-10-CM

## 2023-04-22 DIAGNOSIS — D492 Neoplasm of unspecified behavior of bone, soft tissue, and skin: Secondary | ICD-10-CM

## 2023-04-22 NOTE — Progress Notes (Signed)
   New Patient Visit   Subjective  Alexandra Jennings is a 63 y.o. female who presents for the following: Spot of right groin that has been there a long time but is getting thicker. Patient is spanish speaking. Patient is accompanied by her daughter.  The following portions of the chart were reviewed this encounter and updated as appropriate: medications, allergies, medical history  Review of Systems:  No other skin or systemic complaints except as noted in HPI or Assessment and Plan.  Objective  Well appearing patient in no apparent distress; mood and affect are within normal limits.   A focused examination was performed of the following areas: right groin  Relevant exam findings are noted in the Assessment and Plan.  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 Thigh - Anterior Skin / nail biopsy Type of biopsy: tangential   Informed consent: discussed and consent obtained   Timeout: patient name, date of birth, surgical site, and procedure verified   Procedure prep:  Patient was prepped and draped in usual sterile fashion Prep type:  Isopropyl alcohol Anesthesia: the lesion was anesthetized in a standard fashion   Anesthetic:  1% lidocaine w/ epinephrine 1-100,000 buffered w/ 8.4% NaHCO3 Instrument used: flexible razor blade   Hemostasis achieved with: pressure, aluminum chloride and electrodesiccation   Outcome: patient tolerated procedure well   Post-procedure details: sterile dressing applied and wound care instructions given   Dressing type: bandage and petrolatum   Specimen 1 - Surgical pathology Differential Diagnosis: SK vs other  Check Margins: No  Return if symptoms worsen or fail to improve.  I, Joanie Coddington, CMA, am acting as scribe for Gwenith Daily, MD .   Documentation: I have reviewed the above documentation for accuracy and completeness, and I agree with the above.  Gwenith Daily, MD

## 2023-04-22 NOTE — Patient Instructions (Signed)

## 2023-04-23 LAB — SURGICAL PATHOLOGY

## 2023-07-14 ENCOUNTER — Other Ambulatory Visit: Payer: Self-pay | Admitting: Nurse Practitioner

## 2023-07-14 DIAGNOSIS — I10 Essential (primary) hypertension: Secondary | ICD-10-CM

## 2023-07-15 MED ORDER — AMLODIPINE BESYLATE 10 MG PO TABS
10.0000 mg | ORAL_TABLET | Freq: Every day | ORAL | 0 refills | Status: DC
  Filled 2023-07-15: qty 30, 30d supply, fill #0

## 2023-07-16 ENCOUNTER — Other Ambulatory Visit: Payer: Self-pay

## 2023-07-20 ENCOUNTER — Other Ambulatory Visit: Payer: Self-pay

## 2023-07-21 ENCOUNTER — Other Ambulatory Visit: Payer: Self-pay

## 2023-09-03 ENCOUNTER — Other Ambulatory Visit: Payer: Self-pay

## 2023-09-03 ENCOUNTER — Telehealth: Payer: Self-pay

## 2023-09-03 ENCOUNTER — Other Ambulatory Visit: Payer: Self-pay | Admitting: Family Medicine

## 2023-09-03 DIAGNOSIS — I10 Essential (primary) hypertension: Secondary | ICD-10-CM

## 2023-09-03 MED ORDER — AMLODIPINE BESYLATE 10 MG PO TABS
10.0000 mg | ORAL_TABLET | Freq: Every day | ORAL | 0 refills | Status: DC
  Filled 2023-09-03: qty 30, 30d supply, fill #0

## 2023-09-03 NOTE — Telephone Encounter (Signed)
 Patient came in requesting refills on amLODipine  (NORVASC ) 10 MG  advised patient that the note beside the prescription states she must have an office visit for refills. Appointment made for 7/22 3:30 PM  patient is requesting refills until appointment if possible.

## 2023-09-07 ENCOUNTER — Other Ambulatory Visit: Payer: Self-pay

## 2023-09-22 ENCOUNTER — Ambulatory Visit: Admitting: Nurse Practitioner

## 2023-09-22 ENCOUNTER — Telehealth: Payer: Self-pay | Admitting: Nurse Practitioner

## 2023-09-22 NOTE — Telephone Encounter (Signed)
 Pt confirmed appt (per daniela)

## 2023-09-23 ENCOUNTER — Other Ambulatory Visit: Payer: Self-pay | Admitting: Nurse Practitioner

## 2023-09-23 ENCOUNTER — Ambulatory Visit: Payer: Self-pay | Attending: Nurse Practitioner | Admitting: Nurse Practitioner

## 2023-09-23 ENCOUNTER — Other Ambulatory Visit: Payer: Self-pay

## 2023-09-23 ENCOUNTER — Encounter: Payer: Self-pay | Admitting: Nurse Practitioner

## 2023-09-23 VITALS — BP 147/83 | HR 76 | Resp 19 | Ht 62.0 in | Wt 173.2 lb

## 2023-09-23 DIAGNOSIS — R159 Full incontinence of feces: Secondary | ICD-10-CM

## 2023-09-23 DIAGNOSIS — G8929 Other chronic pain: Secondary | ICD-10-CM

## 2023-09-23 DIAGNOSIS — I10 Essential (primary) hypertension: Secondary | ICD-10-CM

## 2023-09-23 DIAGNOSIS — M25562 Pain in left knee: Secondary | ICD-10-CM

## 2023-09-23 MED ORDER — AMLODIPINE BESYLATE 10 MG PO TABS
10.0000 mg | ORAL_TABLET | Freq: Every day | ORAL | 1 refills | Status: AC
Start: 2023-09-23 — End: ?
  Filled 2023-09-23 – 2023-10-06 (×2): qty 90, 90d supply, fill #0
  Filled 2024-01-21: qty 90, 90d supply, fill #1

## 2023-09-23 MED ORDER — DICLOFENAC SODIUM 1 % EX GEL
4.0000 g | Freq: Four times a day (QID) | CUTANEOUS | 1 refills | Status: AC
Start: 2023-09-23 — End: 2023-10-23
  Filled 2023-09-23: qty 200, 13d supply, fill #0

## 2023-09-23 MED ORDER — LISINOPRIL 10 MG PO TABS
10.0000 mg | ORAL_TABLET | Freq: Every day | ORAL | 1 refills | Status: AC
Start: 2023-09-23 — End: ?
  Filled 2023-09-23 – 2023-10-30 (×2): qty 90, 90d supply, fill #0
  Filled 2024-01-21 – 2024-01-22 (×2): qty 90, 90d supply, fill #1

## 2023-09-23 NOTE — Progress Notes (Signed)
 Assessment & Plan:  Alexandra Jennings was seen today for hypertension, back pain and knee pain.  Diagnoses and all orders for this visit:  Primary hypertension -     amLODipine  (NORVASC ) 10 MG tablet; Take 1 tablet (10 mg total) by mouth daily. -     lisinopril  (ZESTRIL ) 10 MG tablet; Take 1 tablet (10 mg total) by mouth daily.  Chronic pain of left knee -     DG Knee Complete 4 Views Left; Future -     diclofenac  Sodium (VOLTAREN ) 1 % GEL; Apply 4 g topically 4 (four) times daily. To left knee  Incontinence of feces, unspecified fecal incontinence type -     Ambulatory referral to Gastroenterology    Patient has been counseled on age-appropriate routine health concerns for screening and prevention. These are reviewed and up-to-date. Referrals have been placed accordingly. Immunizations are up-to-date or declined.    Subjective:   Chief Complaint  Patient presents with   Hypertension   Back Pain   Knee Pain    History of Present Illness Alexandra Jennings is a 63 year old female who presents with left knee pain and back pain and for follow up to HTN.  VRI was used to communicate directly with patient for the entire encounter including providing detailed patient instructions.     She has been experiencing pain in her left knee for the past month. Initially, the knee was swollen and painful, but cold compresses reduced the swelling. Currently, she experiences mild pain daily, particularly with movements such as standing or crossing her legs. The pain is located in the medial meniscal area and is described as aching. No recent falls or changes in her job that could have contributed to the pain. She also reports intermittent thoracic back and shoulder pain, which she associates with her work that requires standing for five hours. The pain occurs occasionally, particularly after work   VRI was used to communicate directly with patient for the entire encounter including providing  detailed patient instructions.    HTN Blood pressure is elevated.  She endorses adherence taking amlodipine  10 mg and lisinopril  10 mg daily.  States she has not taken her blood pressure medication today however and also missed both doses yesterday. BP Readings from Last 3 Encounters:  09/23/23 (!) 147/83  04/22/23 (!) 151/86  03/25/23 128/84      GI She continues having problems with fecal incontinence. She goes to the bathroom and has a BM then about 15 mins later will have an accident of stool. She was seen by GI and colonoscopy performed. They recommended increased fiber and if it continued to follow up here. Appetite is good. No N/V. No melena or hematochezia.    Review of Systems  Constitutional:  Negative for fever, malaise/fatigue and weight loss.  HENT: Negative.  Negative for nosebleeds.   Eyes: Negative.  Negative for blurred vision, double vision and photophobia.  Respiratory: Negative.  Negative for cough and shortness of breath.   Cardiovascular: Negative.  Negative for chest pain, palpitations and leg swelling.  Gastrointestinal: Negative.  Negative for heartburn, nausea and vomiting.  Genitourinary:        See HPI  Musculoskeletal:  Positive for joint pain. Negative for myalgias.  Neurological: Negative.  Negative for dizziness, focal weakness, seizures and headaches.  Psychiatric/Behavioral: Negative.  Negative for suicidal ideas.     Past Medical History:  Diagnosis Date   Allergy    Cataract    GERD (gastroesophageal reflux  disease)    Hypertension    Prediabetes     Past Surgical History:  Procedure Laterality Date   COLONOSCOPY N/A 01/30/2015   Procedure: COLONOSCOPY;  Surgeon: Margo LITTIE Haddock, MD;  Location: [facility];  Service: Endoscopy;  Laterality: N/A;  1300    TUBAL LIGATION      Family History  Problem Relation Age of Onset   Cancer Mother        uterine   Cancer Father        prostate   Breast cancer Neg Hx    Colon cancer Neg Hx     Esophageal cancer Neg Hx    Rectal cancer Neg Hx    Stomach cancer Neg Hx     Social History Reviewed with no changes to be made today.   Outpatient Medications Prior to Visit  Medication Sig Dispense Refill   amLODipine  (NORVASC ) 10 MG tablet Take 1 tablet (10 mg total) by mouth daily. 30 tablet 0   lisinopril  (ZESTRIL ) 10 MG tablet Take 1 tablet (10 mg total) by mouth daily. 90 tablet 3   No facility-administered medications prior to visit.    No Known Allergies     Objective:    BP (!) 147/83 (BP Location: Left Arm, Patient Position: Sitting, Cuff Size: Normal)   Pulse 76   Resp 19   Ht 5' 2 (1.575 m)   Wt 173 lb 3.2 oz (78.6 kg)   LMP 04/05/2011   SpO2 100%   BMI 31.68 kg/m  Wt Readings from Last 3 Encounters:  09/23/23 173 lb 3.2 oz (78.6 kg)  03/25/23 171 lb (77.6 kg)  03/03/23 172 lb (78 kg)    Physical Exam Vitals and nursing note reviewed.  Constitutional:      Appearance: She is well-developed.  HENT:     Head: Normocephalic and atraumatic.  Cardiovascular:     Rate and Rhythm: Normal rate and regular rhythm.     Heart sounds: Normal heart sounds. No murmur heard.    No friction rub. No gallop.  Pulmonary:     Effort: Pulmonary effort is normal. No tachypnea or respiratory distress.     Breath sounds: Normal breath sounds. No decreased breath sounds, wheezing, rhonchi or rales.  Chest:     Chest wall: No tenderness.  Abdominal:     General: Bowel sounds are normal.     Palpations: Abdomen is soft.  Musculoskeletal:        General: Normal range of motion.     Cervical back: Normal range of motion.  Skin:    General: Skin is warm and dry.  Neurological:     Mental Status: She is alert and oriented to person, place, and time.     Coordination: Coordination normal.  Psychiatric:        Behavior: Behavior normal. Behavior is cooperative.        Thought Content: Thought content normal.        Judgment: Judgment normal.          Patient  has been counseled extensively about nutrition and exercise as well as the importance of adherence with medications and regular follow-up. The patient was given clear instructions to go to ER or return to medical center if symptoms don't improve, worsen or new problems develop. The patient verbalized understanding.   Follow-up: Return if symptoms worsen or fail to improve.   Haze LELON Servant, FNP-BC [facility] [place], [place] [phone]  09/23/2023, 1:26 PM

## 2023-10-06 ENCOUNTER — Other Ambulatory Visit: Payer: Self-pay

## 2023-10-30 ENCOUNTER — Other Ambulatory Visit: Payer: Self-pay

## 2024-01-22 ENCOUNTER — Other Ambulatory Visit: Payer: Self-pay
